# Patient Record
Sex: Male | Born: 1980 | Race: Black or African American | Hispanic: No | Marital: Married | Smoking: Never smoker
Health system: Southern US, Community
[De-identification: ages and names within clinical notes are randomized; demographics above are authoritative.]

## PROBLEM LIST (undated history)

## (undated) DIAGNOSIS — R569 Unspecified convulsions: Secondary | ICD-10-CM

## (undated) DIAGNOSIS — I10 Essential (primary) hypertension: Secondary | ICD-10-CM

## (undated) HISTORY — PX: ANTERIOR CRUCIATE LIGAMENT REPAIR: SHX115

## (undated) HISTORY — PX: SHOULDER SURGERY: SHX246

## (undated) HISTORY — PX: APPENDECTOMY: SHX54

## (undated) HISTORY — PX: FRACTURE SURGERY: SHX138

---

## 2019-10-08 ENCOUNTER — Emergency Department (HOSPITAL_BASED_OUTPATIENT_CLINIC_OR_DEPARTMENT_OTHER)
Admission: EM | Admit: 2019-10-08 | Discharge: 2019-10-08 | Disposition: A | Discharge status: Home / Self Care | Payer: BLUE CROSS/BLUE SHIELD | Attending: Emergency Medicine | Admitting: Emergency Medicine

## 2019-10-08 ENCOUNTER — Other Ambulatory Visit: Payer: Self-pay

## 2019-10-08 ENCOUNTER — Emergency Department (HOSPITAL_BASED_OUTPATIENT_CLINIC_OR_DEPARTMENT_OTHER): Payer: BLUE CROSS/BLUE SHIELD

## 2019-10-08 ENCOUNTER — Encounter (HOSPITAL_BASED_OUTPATIENT_CLINIC_OR_DEPARTMENT_OTHER): Payer: Self-pay | Admitting: Emergency Medicine

## 2019-10-08 DIAGNOSIS — M25571 Pain in right ankle and joints of right foot: Secondary | ICD-10-CM | POA: Diagnosis present

## 2019-10-08 DIAGNOSIS — R2241 Localized swelling, mass and lump, right lower limb: Secondary | ICD-10-CM | POA: Diagnosis not present

## 2019-10-08 DIAGNOSIS — S86001A Unspecified injury of right Achilles tendon, initial encounter: Secondary | ICD-10-CM

## 2019-10-08 MED ORDER — HYDROCODONE-ACETAMINOPHEN 5-325 MG PO TABS: 1.0000 | ORAL_TABLET | ORAL | 0 refills | Status: DC | PRN

## 2019-10-08 MED ORDER — OXYCODONE-ACETAMINOPHEN 5-325 MG PO TABS
1.0000 | ORAL_TABLET | Freq: Once | ORAL | Status: DC
  Filled 2019-10-08: qty 1

## 2019-10-08 NOTE — ED Notes (Signed)
Pt declined ordered pain medication. RN offered non-narcotic alternative such as Motrin. Pt stated he has that at home and will take it once he is home.

## 2019-10-08 NOTE — ED Notes (Signed)
Pt discharged to home. Discharge instructions have been discussed with patient and/or family members. Pt verbally acknowledges understanding d/c instructions, and endorses comprehension to checkout at registration before leaving.  °

## 2019-10-08 NOTE — ED Triage Notes (Signed)
Pt reports playing basketball causing injury to right ankle with a pop sensation. Pt ambulatory with limp, feels he may have torn his achillis tendon.

## 2019-10-08 NOTE — Discharge Instructions (Signed)
You can take Tylenol or Ibuprofen as directed for pain. You can alternate Tylenol and Ibuprofen every 4 hours. If you take Tylenol at 1pm, then you can take Ibuprofen at 5pm. Then you can take Tylenol again at 9pm.   Take pain medications as directed for break through pain. Do not drive or operate machinery while taking this medication.   As we discussed, you will wear the cam walker boot. He can do weightbearing as tolerated. If becomes too painful, you can use the crutches.  As discussed, you will follow-up with Dr. Magnus Ivan. Please call his office arrange for follow-up appointment.  Return emergency department for any worsening pain, swelling, numbness/weakness or any other worsening concerning symptoms.

## 2019-10-08 NOTE — ED Provider Notes (Signed)
[facility] EMERGENCY DEPARTMENT Provider Note   CSN: [id number] Arrival date & time: 10/08/19  1758     History Chief Complaint  Patient presents with  . Ankle Injury    Alex Fields is a 39 y.o. male who presents for evaluation of right ankle pain and swelling.  He reports that about 2:30 PM this afternoon, he was playing basketball.  He reports that he went to step off of his right foot and all of a sudden felt a pop sensation in the posterior aspect of his leg.  Since then he has had swelling and pain.  He reports that he can limp and put some weight on it but he has difficulty moving the foot.  He has not taken anything for pain.  He denies any numbness/weakness.  The history is provided by the patient.       History reviewed. No pertinent past medical history.  There are no problems to display for this patient.   Past Surgical History:  Procedure Laterality Date  . ANTERIOR CRUCIATE LIGAMENT REPAIR    . APPENDECTOMY    . SHOULDER SURGERY         No family history on file.  Social History   Tobacco Use  . Smoking status: Never Smoker  . Smokeless tobacco: Never Used  Substance Use Topics  . Alcohol use: Yes  . Drug use: Never    Home Medications Prior to Admission medications   Medication Sig Start Date End Date Taking? Authorizing Provider  HYDROcodone-acetaminophen (NORCO/VICODIN) 5-325 MG tablet Take 1-2 tablets by mouth every 4 (four) hours as needed. 10/08/19   Volanda Napoleon, PA-C    Allergies    Patient has no known allergies.  Review of Systems   Review of Systems  Musculoskeletal:       Right ankle pain  Neurological: Negative for weakness and numbness.  All other systems reviewed and are negative.   Physical Exam Updated Vital Signs BP (!) 145/109 (BP Location: Right Arm)   Pulse 87   Temp 98.4 F (36.9 C) (Oral)   Resp 20   Ht 6' (1.829 m)   Wt 110.2 kg   SpO2 98%   BMI 32.96 kg/m   Physical Exam Vitals and  nursing note reviewed.  Constitutional:      Appearance: He is well-developed.  HENT:     Head: Normocephalic and atraumatic.  Eyes:     General: No scleral icterus.       Right eye: No discharge.        Left eye: No discharge.     Conjunctiva/sclera: Conjunctivae normal.  Cardiovascular:     Pulses:          Dorsalis pedis pulses are 2+ on the right side and 2+ on the left side.  Pulmonary:     Effort: Pulmonary effort is normal.  Musculoskeletal:     Comments: Tenderness palpation noted to the dorsal aspect of the distal right leg and heel with soft tissue swelling noted.  There is deficits noted with palpation of the Achilles tendon. Positive Thompson test. He can plantarflex but has difficulty dorsiflexing.  He can wiggle all 5 toes.  No bony tenderness noted bilateral malleolus, dorsal aspect of foot.  No distal or proximal tib-fib tenderness palpation.  No deformity or crepitus.  No tenderness palpation left lower extremity.  Skin:    General: Skin is warm and dry.     Capillary Refill: Capillary refill takes less than 2  seconds.     Comments: Good distal cap refill.  RLE is not dusky in appearance or cool to touch.   Neurological:     Mental Status: He is alert.  Psychiatric:        Speech: Speech normal.        Behavior: Behavior normal.     ED Results / Procedures / Treatments   Labs (all labs ordered are listed, but only abnormal results are displayed) Labs Reviewed - No data to display  EKG None  Radiology DG Ankle Complete Right  Result Date: 10/08/2019 CLINICAL DATA:  Popping sensation while playing basketball, unable to bear weight EXAM: RIGHT ANKLE - COMPLETE 3+ VIEW COMPARISON:  None FINDINGS: Corticated fragment seen adjacent the posterior talar process may be remote posttraumatic or accessory ossicle. No acute bony abnormality. Specifically, no fracture, subluxation, or dislocation. Suspect a trace ankle effusion. There is some questionable thickening and  edematous swelling towards the myotendinous junction of the Achilles with slight being stranding within Kager's fat pad. Distal tendon appears normal aside from a small mineralized enthesophyte and a posterior calcaneal spur. Included portions of the mid and hindfoot are unremarkable though are incompletely assessed on nonweightbearing, non dedicated radiographs. IMPRESSION: 1. No definite acute osseous abnormality. 2. Corticated fragment adjacent the posterior talar process may be remote posttraumatic or accessory ossicle. 3. Soft tissue thickening and edematous change towards the myotendinous junction of the Achilles with slight stranding within Kager's fat pad. Correlate with point tenderness and assessment of the patient's biomechanics. Electronically Signed   By: Kreg Shropshire M.D.   On: 10/08/2019 18:57    Procedures Procedures (including critical care time)  Medications Ordered in ED Medications  oxyCODONE-acetaminophen (PERCOCET/ROXICET) 5-325 MG per tablet 1 tablet (1 tablet Oral Not Given 10/08/19 1955)    ED Course  I have reviewed the triage vital signs and the nursing notes.  Pertinent labs & imaging results that were available during my care of the patient were reviewed by me and considered in my medical decision making (see chart for details).    MDM Rules/Calculators/A&P                      39 year old male presents for evaluation of right ankle pain and swelling that began after playing basketball earlier this evening.  He reports he was getting ready step off and states that he felt a popping sensation to the posterior aspect of his leg.  Since then has had pain, swelling.  He denies any numbness/weakness.  Initially arrival, he is afebrile, nontoxic-appearing.  Vital signs are stable.  On exam, he has tenderness palpation as well as swelling in the posterior aspect of the leg.  He has difficulty with plantarflexion of the foot. Positive Thompson test noted.  Concern for Achilles  tendon injury versus fracture versus sprain.  X-rays ordered at triage.  X-rays reviewed.  There is a corticated fragment adjacent to the posterior talar process that may be remote posttraumatic or accessory ossicle.  There is also soft tissue thickening and edematous change for the myotendinous junction of the Achilles with slight stranding within the Cager's fat pad.  This does correlate to where he is tender and he does have concerning signs for possible Achilles tendon injury.  Discussed with Dr. Magnus Ivan (Ortho).  He commence putting patient in a cam walker boot.  Keep patient can be weightbearing as tolerated.  He will plan to see the patient in outpatient follow-up.  Discussed plan  with patient.  He is agreeable. At this time, patient exhibits no emergent life-threatening condition that require further evaluation in ED or admission. Patient had ample opportunity for questions and discussion. All patient's questions were answered with full understanding. Strict return precautions discussed. Patient expresses understanding and agreement to plan.   Portions of this note were generated with Scientist, clinical (histocompatibility and immunogenetics). Dictation errors may occur despite best attempts at proofreading.   Final Clinical Impression(s) / ED Diagnoses Final diagnoses:  Injury of right Achilles tendon, initial encounter    Rx / DC Orders ED Discharge Orders         Ordered    HYDROcodone-acetaminophen (NORCO/VICODIN) 5-325 MG tablet  Every 4 hours PRN     10/08/19 2054           Maxwell Caul, PA-C 10/08/19 2352    Milagros Loll, MD 10/09/19 [id number]

## 2019-10-08 NOTE — ED Notes (Signed)
Ice pack given

## 2019-10-10 ENCOUNTER — Ambulatory Visit: Payer: BLUE CROSS/BLUE SHIELD | Admitting: Orthopedic Surgery

## 2019-10-11 ENCOUNTER — Other Ambulatory Visit: Payer: Self-pay

## 2019-10-11 ENCOUNTER — Ambulatory Visit: Payer: BLUE CROSS/BLUE SHIELD | Admitting: Physician Assistant

## 2019-10-11 ENCOUNTER — Encounter: Payer: Self-pay | Admitting: Physician Assistant

## 2019-10-11 DIAGNOSIS — S86011D Strain of right Achilles tendon, subsequent encounter: Secondary | ICD-10-CM | POA: Diagnosis not present

## 2019-10-11 NOTE — Progress Notes (Signed)
   Office Visit Note     Patient: Alex Fields           Date of Birth: 1981-05-09           MRN: [id number] Visit Date: 10/11/2019              Requested by: Lenox Ponds, MD [address] [place],  [place] [address] PCP: Randel Pigg, Dorma Russell, MD   Assessment & Plan: Visit Diagnoses:  1. Rupture of right Achilles tendon, subsequent encounter     Plan: Discussed with him conservative versus surgical treatment of his right Achilles rupture. Discussed with him risk benefits of surgery. He elects to proceed with the right Achilles with primary repair. Risks of surgery discussed with the patient at length and these include but are not limited to prolonged healing, wound healing problems, infection, and DVT/PE.  Follow-Up Instructions: Return 2 weeks post op.   Orders:  No orders of the defined types were placed in this encounter.  No orders of the defined types were placed in this encounter.     Procedures: No procedures performed   Clinical Data: No additional findings.   Subjective: Chief Complaint  Patient presents with  . Right Ankle - Injury    HPI Alex Fields is a pleasant 39 year old male comes in today with right leg injury this past Sunday. Reports he was playing basketball and it felt pain in the back of his right leg. He was seen at [facility] ER ER and was diagnosed with a right Achilles rupture. Placed in a cam walker boot. He was told to follow-up with orthopedic surgery. Denies any other injury.  He is on Keppra for seizure disorder otherwise healthy. Patient has continued to work in the cam walker boot since he was seen in the ER. 3 views right ankle dated 10/08/2019 are reviewed. These show no acute fracture. Talus well located within the ankle mortise no diastases. There is edema about the Achilles and stranding with in the area of the Kager's fat pad.  Review of systems: Negative for fevers chills see HPI.  Objective: Vital Signs:  There were no vitals taken for this visit.  Physical Exam Constitutional:      Appearance: He is not ill-appearing or diaphoretic.  Pulmonary:     Effort: Pulmonary effort is normal.  Neurological:     Mental Status: He is alert and oriented to person, place, and time.  Psychiatric:        Behavior: Behavior normal.     Ortho Exam Right calf supple nontender. Swelling right Achilles region with palpable gap mid to lower Achilles. Positive Thompson test on the right negative on the left.  Specialty Comments:  No specialty comments available.  Imaging: No results found.   PMFS History: There are no problems to display for this patient.  History reviewed. No pertinent past medical history.  History reviewed. No pertinent family history.  Past Surgical History:  Procedure Laterality Date  . ANTERIOR CRUCIATE LIGAMENT REPAIR    . APPENDECTOMY    . SHOULDER SURGERY     Social History   Occupational History  . Not on file  Tobacco Use  . Smoking status: Never Smoker  . Smokeless tobacco: Never Used  Substance and Sexual Activity  . Alcohol use: Yes  . Drug use: Never  . Sexual activity: Not on file

## 2019-10-13 ENCOUNTER — Other Ambulatory Visit: Payer: Self-pay

## 2019-10-14 ENCOUNTER — Other Ambulatory Visit (HOSPITAL_COMMUNITY)
Admission: RE | Admit: 2019-10-14 | Discharge: 2019-10-14 | Disposition: A | Discharge status: Home / Self Care | Payer: BLUE CROSS/BLUE SHIELD | Source: Ambulatory Visit | Attending: Orthopaedic Surgery | Admitting: Orthopaedic Surgery

## 2019-10-14 DIAGNOSIS — Z20822 Contact with and (suspected) exposure to covid-19: Secondary | ICD-10-CM | POA: Insufficient documentation

## 2019-10-14 DIAGNOSIS — Z01812 Encounter for preprocedural laboratory examination: Secondary | ICD-10-CM | POA: Diagnosis present

## 2019-10-14 LAB — SARS CORONAVIRUS 2 (TAT 6-24 HRS): SARS Coronavirus 2: NEGATIVE

## 2019-10-16 ENCOUNTER — Other Ambulatory Visit: Payer: Self-pay

## 2019-10-16 ENCOUNTER — Other Ambulatory Visit: Payer: Self-pay | Admitting: Physician Assistant

## 2019-10-16 ENCOUNTER — Encounter (HOSPITAL_COMMUNITY): Payer: Self-pay | Admitting: Orthopaedic Surgery

## 2019-10-16 NOTE — Progress Notes (Signed)
Spoke with pt for pre-op call. Pt denies cardiac history, HTN or Diabetes. He does have a hx of seizures but has not had any for "years" per pt.   Covid test done 10/14/19 and it's negative. Pt states he's been in quarantine since the test was done and understands he needs to continue to be in quarantine until he comes to the hospital tomorrow.

## 2019-10-17 ENCOUNTER — Encounter (HOSPITAL_COMMUNITY): Payer: Self-pay | Admitting: Orthopaedic Surgery

## 2019-10-17 ENCOUNTER — Ambulatory Visit (HOSPITAL_COMMUNITY): Payer: BLUE CROSS/BLUE SHIELD | Admitting: Certified Registered Nurse Anesthetist

## 2019-10-17 ENCOUNTER — Encounter (HOSPITAL_COMMUNITY)
Admission: RE | Disposition: A | Discharge status: Home / Self Care | Payer: Self-pay | Source: Ambulatory Visit | Attending: Orthopaedic Surgery

## 2019-10-17 ENCOUNTER — Ambulatory Visit (HOSPITAL_COMMUNITY)
Admission: RE | Admit: 2019-10-17 | Discharge: 2019-10-17 | Disposition: A | Discharge status: Home / Self Care | Payer: BLUE CROSS/BLUE SHIELD | Source: Ambulatory Visit | Attending: Orthopaedic Surgery | Admitting: Orthopaedic Surgery

## 2019-10-17 DIAGNOSIS — S86011A Strain of right Achilles tendon, initial encounter: Secondary | ICD-10-CM

## 2019-10-17 DIAGNOSIS — S86011D Strain of right Achilles tendon, subsequent encounter: Secondary | ICD-10-CM

## 2019-10-17 DIAGNOSIS — Y9367 Activity, basketball: Secondary | ICD-10-CM | POA: Diagnosis not present

## 2019-10-17 DIAGNOSIS — X58XXXA Exposure to other specified factors, initial encounter: Secondary | ICD-10-CM | POA: Diagnosis not present

## 2019-10-17 HISTORY — PX: ACHILLES TENDON SURGERY: SHX542

## 2019-10-17 HISTORY — DX: Unspecified convulsions: R56.9

## 2019-10-17 LAB — BASIC METABOLIC PANEL
Anion gap: 9 (ref 5–15)
BUN: 13 mg/dL (ref 6–20)
CO2: 23 mmol/L (ref 22–32)
Calcium: 9.4 mg/dL (ref 8.9–10.3)
Chloride: 103 mmol/L (ref 98–111)
Creatinine, Ser: 1.05 mg/dL (ref 0.61–1.24)
GFR calc Af Amer: >60 mL/min (ref >60)
GFR calc non Af Amer: >60 mL/min (ref >60)
Glucose, Bld: 101 mg/dL — ABNORMAL HIGH (ref 70–99)
Potassium: 4.4 mmol/L (ref 3.5–5.1)
Sodium: 135 mmol/L (ref 135–145)

## 2019-10-17 LAB — HEMOGLOBIN: Hemoglobin: 16.1 g/dL (ref 13.0–17.0)

## 2019-10-17 SURGERY — REPAIR, TENDON, ACHILLES
Anesthesia: General | Laterality: Right

## 2019-10-17 MED ORDER — PHENYLEPHRINE 40 MCG/ML (10ML) SYRINGE FOR IV PUSH (FOR BLOOD PRESSURE SUPPORT)
PREFILLED_SYRINGE | INTRAVENOUS | Status: DC | PRN
  Administered 2019-10-17: 80 ug via INTRAVENOUS

## 2019-10-17 MED ORDER — ONDANSETRON HCL 4 MG/2ML IJ SOLN
INTRAMUSCULAR | Status: DC | PRN
  Administered 2019-10-17: 4 mg via INTRAVENOUS

## 2019-10-17 MED ORDER — CEFAZOLIN SODIUM-DEXTROSE 2-4 GM/100ML-% IV SOLN
2.0000 g | INTRAVENOUS | Status: AC
  Administered 2019-10-17: 2 g via INTRAVENOUS
  Filled 2019-10-17: qty 100

## 2019-10-17 MED ORDER — PHENYLEPHRINE HCL-NACL 10-0.9 MG/250ML-% IV SOLN
INTRAVENOUS | Status: DC | PRN
  Administered 2019-10-17: 25 ug/min via INTRAVENOUS

## 2019-10-17 MED ORDER — LIDOCAINE 2% (20 MG/ML) 5 ML SYRINGE
INTRAMUSCULAR | Status: DC | PRN
  Administered 2019-10-17: 60 mg via INTRAVENOUS

## 2019-10-17 MED ORDER — PROPOFOL 10 MG/ML IV BOLUS
INTRAVENOUS | Status: DC | PRN
  Administered 2019-10-17: 200 mg via INTRAVENOUS

## 2019-10-17 MED ORDER — ROPIVACAINE HCL 5 MG/ML IJ SOLN
INTRAMUSCULAR | Status: DC | PRN
  Administered 2019-10-17: 10 mL via PERINEURAL
  Administered 2019-10-17: 30 mL via PERINEURAL

## 2019-10-17 MED ORDER — LABETALOL HCL 5 MG/ML IV SOLN: 10.0000 mg | Freq: Once | INTRAVENOUS | Status: AC

## 2019-10-17 MED ORDER — SODIUM CHLORIDE 0.9 % IR SOLN
Status: DC | PRN
  Administered 2019-10-17: 1000 mL

## 2019-10-17 MED ORDER — FENTANYL CITRATE (PF) 100 MCG/2ML IJ SOLN
INTRAMUSCULAR | Status: AC
  Administered 2019-10-17: 100 ug via INTRAVENOUS
  Filled 2019-10-17: qty 2

## 2019-10-17 MED ORDER — DEXAMETHASONE SODIUM PHOSPHATE 10 MG/ML IJ SOLN
INTRAMUSCULAR | Status: DC | PRN
  Administered 2019-10-17: 10 mg via INTRAVENOUS

## 2019-10-17 MED ORDER — LABETALOL HCL 5 MG/ML IV SOLN
INTRAVENOUS | Status: AC
  Administered 2019-10-17: 10 mg via INTRAVENOUS
  Filled 2019-10-17: qty 4

## 2019-10-17 MED ORDER — CHLORHEXIDINE GLUCONATE 0.12 % MT SOLN
15.0000 mL | Freq: Once | OROMUCOSAL | Status: AC
  Administered 2019-10-17: 15 mL via OROMUCOSAL
  Filled 2019-10-17: qty 15

## 2019-10-17 MED ORDER — ORAL CARE MOUTH RINSE: 15.0000 mL | Freq: Once | OROMUCOSAL | Status: AC

## 2019-10-17 MED ORDER — HYDROCODONE-ACETAMINOPHEN 5-325 MG PO TABS: 1.0000 | ORAL_TABLET | Freq: Four times a day (QID) | ORAL | 0 refills | Status: DC | PRN

## 2019-10-17 MED ORDER — ROCURONIUM BROMIDE 10 MG/ML (PF) SYRINGE
PREFILLED_SYRINGE | INTRAVENOUS | Status: DC | PRN
  Administered 2019-10-17: 70 mg via INTRAVENOUS

## 2019-10-17 MED ORDER — PROPOFOL 10 MG/ML IV BOLUS
INTRAVENOUS | Status: AC
  Filled 2019-10-17: qty 20

## 2019-10-17 MED ORDER — SUGAMMADEX SODIUM 200 MG/2ML IV SOLN
INTRAVENOUS | Status: DC | PRN
  Administered 2019-10-17: 250 mg via INTRAVENOUS

## 2019-10-17 MED ORDER — FENTANYL CITRATE (PF) 250 MCG/5ML IJ SOLN
INTRAMUSCULAR | Status: DC | PRN
  Administered 2019-10-17 (×2): 50 ug via INTRAVENOUS

## 2019-10-17 MED ORDER — FENTANYL CITRATE (PF) 100 MCG/2ML IJ SOLN: 100.0000 ug | Freq: Once | INTRAMUSCULAR | Status: AC

## 2019-10-17 MED ORDER — FENTANYL CITRATE (PF) 250 MCG/5ML IJ SOLN
INTRAMUSCULAR | Status: AC
  Filled 2019-10-17: qty 5

## 2019-10-17 MED ORDER — MIDAZOLAM HCL 2 MG/2ML IJ SOLN
INTRAMUSCULAR | Status: AC
  Filled 2019-10-17: qty 2

## 2019-10-17 MED ORDER — MIDAZOLAM HCL 2 MG/2ML IJ SOLN: 2.0000 mg | Freq: Once | INTRAMUSCULAR | Status: AC

## 2019-10-17 MED ORDER — MIDAZOLAM HCL 2 MG/2ML IJ SOLN
INTRAMUSCULAR | Status: AC
  Administered 2019-10-17: 2 mg via INTRAVENOUS
  Filled 2019-10-17: qty 2

## 2019-10-17 MED ORDER — LACTATED RINGERS IV SOLN: INTRAVENOUS | Status: DC

## 2019-10-17 SURGICAL SUPPLY — 37 items
BNDG ELASTIC 4X5.8 VLCR STR LF (GAUZE/BANDAGES/DRESSINGS) ×1 IMPLANT
BNDG ESMARK 4X9 LF (GAUZE/BANDAGES/DRESSINGS) ×1 IMPLANT
CUFF TOURN SGL QUICK 34 (TOURNIQUET CUFF) ×1
CUFF TRNQT CYL 34X4.125X (TOURNIQUET CUFF) IMPLANT
DRAPE U-SHAPE 47X51 STRL (DRAPES) ×2 IMPLANT
DRSG XEROFORM 1X8 (GAUZE/BANDAGES/DRESSINGS) ×1 IMPLANT
DURAPREP 26ML APPLICATOR (WOUND CARE) ×2 IMPLANT
ELECT REM PT RETURN 9FT ADLT (ELECTROSURGICAL) ×2
ELECTRODE REM PT RTRN 9FT ADLT (ELECTROSURGICAL) ×1 IMPLANT
GAUZE SPONGE 4X4 12PLY STRL LF (GAUZE/BANDAGES/DRESSINGS) ×1 IMPLANT
GLOVE BIO SURGEON STRL SZ8 (GLOVE) ×2 IMPLANT
GLOVE BIOGEL PI IND STRL 8 (GLOVE) ×1 IMPLANT
GLOVE BIOGEL PI INDICATOR 8 (GLOVE) ×1
GLOVE ORTHO TXT STRL SZ7.5 (GLOVE) ×2 IMPLANT
GOWN STRL REUS W/ TWL LRG LVL3 (GOWN DISPOSABLE) ×2 IMPLANT
GOWN STRL REUS W/ TWL XL LVL3 (GOWN DISPOSABLE) ×4 IMPLANT
GOWN STRL REUS W/TWL LRG LVL3 (GOWN DISPOSABLE) ×2
GOWN STRL REUS W/TWL XL LVL3 (GOWN DISPOSABLE) ×4
KIT TURNOVER KIT B (KITS) ×2 IMPLANT
NS IRRIG 1000ML POUR BTL (IV SOLUTION) ×2 IMPLANT
PACK ORTHO EXTREMITY (CUSTOM PROCEDURE TRAY) ×2 IMPLANT
PAD ARMBOARD 7.5X6 YLW CONV (MISCELLANEOUS) ×4 IMPLANT
PAD CAST 4YDX4 CTTN HI CHSV (CAST SUPPLIES) IMPLANT
PADDING CAST COTTON 4X4 STRL (CAST SUPPLIES) ×1
SUT ETHIBOND NAB CT1 #1 30IN (SUTURE) ×1 IMPLANT
SUT ETHILON 2 0 FS 18 (SUTURE) ×2 IMPLANT
SUT FIBERWIRE #2 38 T-5 BLUE (SUTURE) ×4
SUT VIC AB 0 CT1 27 (SUTURE) ×1
SUT VIC AB 0 CT1 27XBRD ANBCTR (SUTURE) IMPLANT
SUT VIC AB 1 CT1 36 (SUTURE) ×1 IMPLANT
SUT VIC AB 2-0 CT1 27 (SUTURE) ×1
SUT VIC AB 2-0 CT1 TAPERPNT 27 (SUTURE) IMPLANT
SUTURE FIBERWR #2 38 T-5 BLUE (SUTURE) ×4 IMPLANT
TOWEL GREEN STERILE (TOWEL DISPOSABLE) ×2 IMPLANT
TOWEL GREEN STERILE FF (TOWEL DISPOSABLE) ×2 IMPLANT
TUBE CONNECTING 12X1/4 (SUCTIONS) ×2 IMPLANT
YANKAUER SUCT BULB TIP NO VENT (SUCTIONS) ×2 IMPLANT

## 2019-10-17 NOTE — Anesthesia Preprocedure Evaluation (Addendum)
Anesthesia Evaluation  Patient identified by MRN, date of birth, ID band Patient awake    Reviewed: Allergy & Precautions, NPO status , Patient's Chart, lab work & pertinent test results  History of Anesthesia Complications Negative for: history of anesthetic complications  Airway Mallampati: II  TM Distance: >3 FB Neck ROM: Full    Dental  (+) Teeth Intact   Pulmonary neg pulmonary ROS,    Pulmonary exam normal        Cardiovascular hypertension (not formally diagnosed, no meds), Normal cardiovascular exam     Neuro/Psych Seizures -,  negative psych ROS   GI/Hepatic negative GI ROS, Neg liver ROS,   Endo/Other  negative endocrine ROS  Renal/GU negative Renal ROS  negative genitourinary   Musculoskeletal negative musculoskeletal ROS (+)   Abdominal   Peds  Hematology negative hematology ROS (+)   Anesthesia Other Findings   Reproductive/Obstetrics                            Anesthesia Physical Anesthesia Plan  ASA: III  Anesthesia Plan: General   Post-op Pain Management: GA combined w/ Regional for post-op pain   Induction: Intravenous  PONV Risk Score and Plan: 2 and Ondansetron, Dexamethasone, Midazolam and Treatment may vary due to age or medical condition  Airway Management Planned: Oral ETT  Additional Equipment: None  Intra-op Plan:   Post-operative Plan: Extubation in OR  Informed Consent: I have reviewed the patients History and Physical, chart, labs and discussed the procedure including the risks, benefits and alternatives for the proposed anesthesia with the patient or authorized representative who has indicated his/her understanding and acceptance.     Dental advisory given  Plan Discussed with:   Anesthesia Plan Comments:        Anesthesia Quick Evaluation

## 2019-10-17 NOTE — Brief Op Note (Signed)
10/17/2019  3:26 PM  PATIENT:  Alex Fields  39 y.o. male  PRE-OPERATIVE DIAGNOSIS:  right achilles rupture  POST-OPERATIVE DIAGNOSIS:  right achilles rupture  PROCEDURE:  Procedure(s): PRIMARY RIGHT ACHILLES TENDON REPAIR (Right)  SURGEON:  Surgeon(s) and Role:    Kathryne Hitch, MD - Primary  PHYSICIAN ASSISTANT: Rexene Edison, PA-C  ANESTHESIA:   regional and general  EBL:  10 mL   COUNTS:  YES  TOURNIQUET:  * Missing tourniquet times found for documented tourniquets in log: [id number] *  DICTATION: .Other Dictation: Dictation Number [id number]  PLAN OF CARE: Discharge to home after PACU  PATIENT DISPOSITION:  PACU - hemodynamically stable.   Delay start of Pharmacological VTE agent (>24hrs) due to surgical blood loss or risk of bleeding: no

## 2019-10-17 NOTE — Progress Notes (Signed)
Orthopedic Tech Progress Note Patient Details:  Alex Fields [birth year] [id number]  Ortho Devices Type of Ortho Device: Crutches Ortho Device/Splint Interventions: Ordered   Post Interventions Instructions Provided: Adjustment of device, Care of device, Poper ambulation with device   PACU called and requested crutches for the patient.   Gerald Stabs 10/17/2019, 5:05 PM

## 2019-10-17 NOTE — Anesthesia Procedure Notes (Signed)
Procedure Name: Intubation Date/Time: 10/17/2019 2:35 PM Performed by: Bryson Corona, CRNA Pre-anesthesia Checklist: Patient identified, Emergency Drugs available, Suction available and Patient being monitored Patient Re-evaluated:Patient Re-evaluated prior to induction Oxygen Delivery Method: Circle System Utilized Preoxygenation: Pre-oxygenation with 100% oxygen Induction Type: IV induction Ventilation: Oral airway inserted - appropriate to patient size and Two handed mask ventilation required Laryngoscope Size: Mac and 4 Grade View: Grade I Tube type: Oral Tube size: 7.5 mm Number of attempts: 1 Airway Equipment and Method: Stylet and Oral airway Placement Confirmation: ETT inserted through vocal cords under direct vision,  positive ETCO2 and breath sounds checked- equal and bilateral Secured at: 23 cm Tube secured with: Tape Dental Injury: Teeth and Oropharynx as per pre-operative assessment

## 2019-10-17 NOTE — H&P (Signed)
Alex Fields is an 39 y.o. male.   Chief Complaint: Right ankle pain and weakness with known Achilles tendon rupture HPI: The patient is a 39 year old gentleman who over a week ago was playing basketball and had an injury sustained to his right ankle at the Achilles area.  He was seen in the emergency room and placed appropriate in a cam walking boot.  We saw him in the office and he had a positive Thompson test with an obvious deficit in the Achilles to the right side.  We recommended a direct primary repair of an acute complete Achilles tendon rupture on the right side.  Past Medical History:  Diagnosis Date  . Seizures (HCC)     Past Surgical History:  Procedure Laterality Date  . ANTERIOR CRUCIATE LIGAMENT REPAIR    . APPENDECTOMY    . FRACTURE SURGERY Right    wrist   . SHOULDER SURGERY      History reviewed. No pertinent family history. Social History:  reports that he has never smoked. He has never used smokeless tobacco. He reports current alcohol use. He reports that he does not use drugs.  Allergies: No Known Allergies  No medications prior to admission.    No results found for this or any previous visit (from the past 48 hour(s)). No results found.  Review of Systems  All other systems reviewed and are negative.   There were no vitals taken for this visit. Physical Exam  Constitutional: He is oriented to person, place, and time. He appears well-developed and well-nourished.  HENT:  Head: Normocephalic and atraumatic.  Eyes: Pupils are equal, round, and reactive to light. EOM are normal.  Respiratory: Effort normal.  GI: Soft.  Musculoskeletal:     Cervical back: Normal range of motion and neck supple.     Right ankle: Swelling and ecchymosis present. Decreased range of motion.     Right Achilles Tendon: Thompson's test positive.  Neurological: He is alert and oriented to person, place, and time.  Skin: Skin is warm and dry.     Assessment/Plan Complete  right Achilles tendon rupture  Our plan is to proceed to surgery today as an outpatient for direct primary repair of the ruptured right Achilles tendon.  A thorough discussion of the risk and benefits of surgery was had and informed consent is obtained.    Kathryne Hitch, MD 10/17/2019, 11:25 AM

## 2019-10-17 NOTE — Anesthesia Procedure Notes (Signed)
Anesthesia Regional Block: Popliteal block   Pre-Anesthetic Checklist: ,, timeout performed, Correct Patient, Correct Site, Correct Laterality, Correct Procedure, Correct Position, site marked, Risks and benefits discussed,  Surgical consent,  Pre-op evaluation,  At surgeon's request and post-op pain management  Laterality: Right  Prep: chloraprep       Needles:  Injection technique: Single-shot  Needle Type: Echogenic Stimulator Needle     Needle Length: 10cm  Needle Gauge: 20     Additional Needles:   Procedures:,,,, ultrasound used (permanent image in chart),,,,  Narrative:  Start time: 10/17/2019 1:56 PM End time: 10/17/2019 1:59 PM Injection made incrementally with aspirations every 5 mL.  Performed by: Personally  Anesthesiologist: Lucretia Kern, MD  Additional Notes: Monitors applied. Injection made in 5cc increments. No resistance to injection. Good needle visualization. Patient tolerated procedure well.

## 2019-10-17 NOTE — Transfer of Care (Signed)
Immediate Anesthesia Transfer of Care Note  Patient: Alex Fields  Procedure(s) Performed: PRIMARY RIGHT ACHILLES TENDON REPAIR (Right )  Patient Location: PACU  Anesthesia Type:General  Level of Consciousness: drowsy  Airway & Oxygen Therapy: Patient Spontanous Breathing and Patient connected to face mask oxygen  Post-op Assessment: Report given to RN and Post -op Vital signs reviewed and stable  Post vital signs: Reviewed and stable  Last Vitals:  Vitals Value Taken Time  BP 147/93 10/17/19 1551  Temp    Pulse 82 10/17/19 1552  Resp 19 10/17/19 1552  SpO2 100 % 10/17/19 1552  Vitals shown include unvalidated device data.  Last Pain:  Vitals:   10/17/19 1247  TempSrc: Temporal  PainSc:       Patients Stated Pain Goal: 6 (10/17/19 1238)  Complications: No apparent anesthesia complications

## 2019-10-17 NOTE — Anesthesia Postprocedure Evaluation (Signed)
Anesthesia Post Note  Patient: Alex Fields  Procedure(s) Performed: PRIMARY RIGHT ACHILLES TENDON REPAIR (Right )     Patient location during evaluation: PACU Anesthesia Type: General Level of consciousness: awake and alert Pain management: pain level controlled Vital Signs Assessment: post-procedure vital signs reviewed and stable Respiratory status: spontaneous breathing, nonlabored ventilation, respiratory function stable and patient connected to nasal cannula oxygen Cardiovascular status: blood pressure returned to baseline and stable Postop Assessment: no apparent nausea or vomiting Anesthetic complications: no    Last Vitals:  Vitals:   10/17/19 1405 10/17/19 1551  BP: (!) 167/117 (!) 147/93  Pulse: 76 92  Resp: 14 18  Temp:  36.4 C  SpO2: 100% 100%    Last Pain:  Vitals:   10/17/19 1551  TempSrc:   PainSc: 0-No pain                 Lucretia Kern

## 2019-10-17 NOTE — Anesthesia Procedure Notes (Signed)
Anesthesia Regional Block: Adductor canal block   Pre-Anesthetic Checklist: ,, timeout performed, Correct Patient, Correct Site, Correct Laterality, Correct Procedure, Correct Position, site marked, Risks and benefits discussed,  Surgical consent,  Pre-op evaluation,  At surgeon's request and post-op pain management  Laterality: Right  Prep: chloraprep       Needles:  Injection technique: Single-shot  Needle Type: Echogenic Stimulator Needle     Needle Length: 10cm  Needle Gauge: 20     Additional Needles:   Procedures:,,,, ultrasound used (permanent image in chart),,,,  Narrative:  Start time: 10/17/2019 1:53 PM End time: 10/17/2019 1:56 PM Injection made incrementally with aspirations every 5 mL.  Performed by: Personally  Anesthesiologist: Lucretia Kern, MD  Additional Notes: Monitors applied. Injection made in 5cc increments. No resistance to injection. Good needle visualization. Patient tolerated procedure well.

## 2019-10-17 NOTE — Discharge Instructions (Signed)
Keep your right lower extremity splint clean and dry. You will need to be nonweightbearing on the right ankle for the next 4 weeks. Elevate your right leg and ankle as needed for swelling. Bring your walking boot to your first postoperative follow-up appointment with orthopedics. Please take a full-strength 325 mg aspirin daily.

## 2019-10-18 NOTE — Op Note (Signed)
NAMEDEONTRAY, Alex Fields MEDICAL RECORD [id number] ACCOUNT [id number] DATE OF BIRTH:1980-12-08 FACILITY: [facility] LOCATION: [facility] PHYSICIAN:Karnell Vanderloop Aretha Parrot, MD  OPERATIVE REPORT  DATE OF PROCEDURE:  10/17/2019  PREOPERATIVE DIAGNOSIS:  Right complete Achilles tendon rupture.  POSTOPERATIVE DIAGNOSIS:  Right complete Achilles tendon rupture.  PROCEDURE:  Direct primary repair of ruptured right Achilles tendon.  SURGEON:  Vanita Panda. Magnus Ivan, MD  ASSISTANT:  Richardean Canal, PA-C  ANESTHESIA: 1.  Right lower extremity popliteal block. 2.  General.  TOURNIQUET TIME:  Less than 1 hour.  ESTIMATED BLOOD LOSS:  Less than 25 mL.  ANTIBIOTICS:  Two grams IV Ancef.  COMPLICATIONS:  None.  INDICATIONS:  The patient is a 39 year old gentleman who was playing basketball just over a week ago when he sustained an injury to his right Achilles tendon sustaining a complete rupture.  We saw him in the office and he had a positive Thompson test  with a deficit in the Achilles tendon.  We did recommend a direct primary repair.  We explained to him the options of nonoperative and operative treatment options and what the risks and benefits involved of both of these options.  He did elect to proceed  with surgery.  He has been in a Cam walking boot with limited weightbearing.  DESCRIPTION OF PROCEDURE:  After informed consent was obtained and appropriate right ankle was marked anesthesia obtained a popliteal block in the holding room.  He was then brought to the operating room.  General anesthesia was obtained while he was on  a stretcher in supine position.  He was then rolled in a prone position with appropriate padding with chest rolls and padding of bony prominences.  His genitals were assessed and protected and his arms were placed in appropriate position.  A nonsterile  tourniquet was placed around his upper right thigh before he was rolled over.  His calf, ankle and foot were prepped  and draped with DuraPrep and sterile drapes.  A timeout was called and he was identified as correct patient, correct right ankle.  We  confirmed the rupture with a positive Thompson test.  We used the Esmarch to wrap out the ankle tourniquet was inflated to 300 mm of pressure.  I then made a direct midline incision over the Achilles and carried this proximally and distally.  My incision  was just off the midline.  We dissected down to the Achilles and found a complete rupture of the Achilles tendon.  We freshened of the ruptured ends and then ran a Krakow suture through the proximal stump and 1 through the distal stump.  We were able to  tie these down to bring the Achilles rupture together.  There was a separate piece of the Achilles that we then undersewed and oversewed to the Achilles stump distally using #2 Ethibond suture.  We then performed a Janee Morn test again, and his foot did  plantarflex.  We then closed the deep tissue over the Achilles with 0 Vicryl followed by 2-0 Vicryl to close the subcutaneous tissue and interrupted 2-0 nylon to reapproximate the skin.  Xeroform and well-padded sterile dressing was applied and his foot  was placed in a plantar flexed position in a splint.  He was rolled back into supine position, awakened, extubated, and taken to recovery room in stable condition.  All final counts were correct.  There were no complications noted.  Postoperatively, he  will be discharged home from the PACU with nonweightbearing instructions include splint care instructions.  He will  follow up in the office in 2 weeks.  Of note, Benita Stabile, PA-C, assisted in the entire case.  His assistance was crucial for facilitating  all aspects of this case.  CN/NUANCE  D:10/17/2019 T:10/18/2019 JOB:[id number]

## 2019-10-31 ENCOUNTER — Telehealth: Payer: Self-pay | Admitting: Orthopaedic Surgery

## 2019-10-31 ENCOUNTER — Other Ambulatory Visit: Payer: Self-pay

## 2019-10-31 ENCOUNTER — Encounter: Payer: Self-pay | Admitting: Orthopaedic Surgery

## 2019-10-31 ENCOUNTER — Ambulatory Visit (INDEPENDENT_AMBULATORY_CARE_PROVIDER_SITE_OTHER): Payer: BLUE CROSS/BLUE SHIELD | Admitting: Orthopaedic Surgery

## 2019-10-31 DIAGNOSIS — S86011D Strain of right Achilles tendon, subsequent encounter: Secondary | ICD-10-CM

## 2019-10-31 NOTE — Telephone Encounter (Signed)
LMOM for patient letting him know that we can fax his notes but most likely they will still want the forms filled out

## 2019-10-31 NOTE — Telephone Encounter (Signed)
Patient called asked if the attending provider statement that he left 10/25/2019 was faxed back to his insurance company. The number to contact patient is [phone]

## 2019-10-31 NOTE — Progress Notes (Addendum)
HPI: Alex Fields returns now 2 weeks status post right Achilles tendon primary repair.  He has been nonweightbearing.  He did have a fall came down on the leg on his right leg.  Otherwise he is doing well.  He has been nonweightbearing using a knee scooter.  He is taking baby aspirin daily.  He is no longer taking any pain medications.  Physical exam: Right Achilles surgical incision is healing well well approximated wound interrupted nylon sutures no signs of infection.  Thompson's test is negative.  Right calf supple nontender.  Impression: Status post right Achilles tendon repair 10/17/2019  Plan: He will remain nonweightbearing with cam walker boot until we see him back at next office visit.  Sutures were removed Steri-Strips applied.  We will see him back in 1 month sooner if there is any questions concerns.  He is to wear the boot at all times except when showering or at rest.  Questions were encouraged and answered.  He will remain on his baby aspirin daily while nonweightbearing.

## 2019-10-31 NOTE — Telephone Encounter (Signed)
Pt called wanting to confirm the status of his [employer] paperwork; pt also stated that every time he comes in and needs something filled out he's paid $25 dollars and was wondering if there was a way we could just fax his appt notes to his job after the appt? Pt would like a call back.   [phone]

## 2019-11-01 ENCOUNTER — Telehealth: Payer: Self-pay | Admitting: Orthopaedic Surgery

## 2019-11-01 NOTE — Telephone Encounter (Signed)
Patient called.   He is following up on a attending physicians statement that was given to [place] on 10/25/2019  Call back: [phone]

## 2019-11-01 NOTE — Telephone Encounter (Signed)
Patient states he talked to [place] and they got everything faxed

## 2019-11-08 ENCOUNTER — Ambulatory Visit: Payer: Self-pay

## 2019-11-08 ENCOUNTER — Telehealth: Payer: Self-pay | Admitting: Orthopaedic Surgery

## 2019-11-08 ENCOUNTER — Telehealth: Payer: Self-pay

## 2019-11-08 DIAGNOSIS — M25561 Pain in right knee: Secondary | ICD-10-CM

## 2019-11-08 NOTE — Telephone Encounter (Signed)
Not sure if I can really do anything with just that information.  He's welcome to make an appt with Bronson Curb or Magnus Ivan for next available.  Thanks.

## 2019-11-08 NOTE — Telephone Encounter (Signed)
Patient called advised he is having joint pain in his hands,neck and body. Patient asked if this is related to the surgery? Patient had surgery 10/17/2019. The number to contact patient is [phone]

## 2019-11-08 NOTE — Telephone Encounter (Signed)
Patient called stating that for the last 3 days he has been having pain in his hands, neck, and back in the mornings, but as the day goes on, it gets better.  Patient had right achilles tendon surgery on 10/17/2019.  CB# [phone].  Please advise.  Thank you.

## 2019-11-08 NOTE — Telephone Encounter (Signed)
Can you advise? 

## 2019-11-08 NOTE — Telephone Encounter (Signed)
Patient aware that these are not common side effects

## 2019-11-10 ENCOUNTER — Other Ambulatory Visit: Payer: Self-pay

## 2019-11-10 ENCOUNTER — Emergency Department (HOSPITAL_COMMUNITY)
Admission: EM | Admit: 2019-11-10 | Discharge: 2019-11-10 | Disposition: A | Discharge status: Home / Self Care | Payer: BLUE CROSS/BLUE SHIELD | Attending: Emergency Medicine | Admitting: Emergency Medicine

## 2019-11-10 ENCOUNTER — Encounter (HOSPITAL_COMMUNITY): Payer: Self-pay

## 2019-11-10 ENCOUNTER — Ambulatory Visit (INDEPENDENT_AMBULATORY_CARE_PROVIDER_SITE_OTHER)
Admission: EM | Admit: 2019-11-10 | Discharge: 2019-11-10 | Disposition: A | Discharge status: Other Acute Inpatient Hospital | Payer: BLUE CROSS/BLUE SHIELD | Source: Home / Self Care

## 2019-11-10 ENCOUNTER — Emergency Department (HOSPITAL_COMMUNITY): Payer: BLUE CROSS/BLUE SHIELD

## 2019-11-10 DIAGNOSIS — Z79899 Other long term (current) drug therapy: Secondary | ICD-10-CM | POA: Insufficient documentation

## 2019-11-10 DIAGNOSIS — Z20822 Contact with and (suspected) exposure to covid-19: Secondary | ICD-10-CM | POA: Diagnosis not present

## 2019-11-10 DIAGNOSIS — R079 Chest pain, unspecified: Secondary | ICD-10-CM

## 2019-11-10 DIAGNOSIS — R0789 Other chest pain: Secondary | ICD-10-CM | POA: Insufficient documentation

## 2019-11-10 DIAGNOSIS — Z7982 Long term (current) use of aspirin: Secondary | ICD-10-CM | POA: Diagnosis not present

## 2019-11-10 LAB — SARS CORONAVIRUS 2 BY RT PCR (HOSPITAL ORDER, PERFORMED IN ~~LOC~~ HOSPITAL LAB): SARS Coronavirus 2: NEGATIVE

## 2019-11-10 LAB — CBC
HCT: 49.1 % (ref 39.0–52.0)
Hemoglobin: 16.2 g/dL (ref 13.0–17.0)
MCH: 28.3 pg (ref 26.0–34.0)
MCHC: 33 g/dL (ref 30.0–36.0)
MCV: 85.7 fL (ref 80.0–100.0)
Platelets: 244 10*3/uL (ref 150–400)
RBC: 5.73 MIL/uL (ref 4.22–5.81)
RDW: 13.2 % (ref 11.5–15.5)
WBC: 7 10*3/uL (ref 4.0–10.5)
nRBC: 0 % (ref 0.0–0.2)

## 2019-11-10 LAB — BASIC METABOLIC PANEL
Anion gap: 12 (ref 5–15)
BUN: 15 mg/dL (ref 6–20)
CO2: 22 mmol/L (ref 22–32)
Calcium: 9.7 mg/dL (ref 8.9–10.3)
Chloride: 100 mmol/L (ref 98–111)
Creatinine, Ser: 1.02 mg/dL (ref 0.61–1.24)
GFR calc Af Amer: >60 mL/min (ref >60)
GFR calc non Af Amer: >60 mL/min (ref >60)
Glucose, Bld: 96 mg/dL (ref 70–99)
Potassium: 4.7 mmol/L (ref 3.5–5.1)
Sodium: 134 mmol/L — ABNORMAL LOW (ref 135–145)

## 2019-11-10 LAB — SEDIMENTATION RATE: Sed Rate: 5 mm/hr (ref 0–16)

## 2019-11-10 LAB — D-DIMER, QUANTITATIVE: D-Dimer, Quant: 0.45 ug/mL-FEU (ref 0.00–0.50)

## 2019-11-10 LAB — TROPONIN I (HIGH SENSITIVITY)
Troponin I (High Sensitivity): 6 ng/L (ref <18)
Troponin I (High Sensitivity): 7 ng/L (ref <18)

## 2019-11-10 LAB — C-REACTIVE PROTEIN: CRP: 1 mg/dL — ABNORMAL HIGH (ref <1.0)

## 2019-11-10 MED ORDER — NAPROXEN 500 MG PO TABS: 500.0000 mg | ORAL_TABLET | Freq: Two times a day (BID) | ORAL | 0 refills | Status: AC

## 2019-11-10 NOTE — ED Provider Notes (Signed)
[facility] EMERGENCY DEPARTMENT Provider Note   CSN: [id number] Arrival date & time: 11/10/19  1052     History Chief Complaint  Patient presents with  . Chest Pain    Alex Fields is a 39 y.o. male.  HPI   Pt is a 39 y/o male with a h/o seizures who presents to the ED today for eval of chest pain that started 5 days ago and has gradually worsened since onset. States 5 days ago he also experienced diffuse myalgias to his entire upper body. This has improved but the chest pain has persisted. Pain located to the middle of the chest and feels like a pressure. Rates pain 6/10. Pain is constant. Pain is worse when laying flat and somewhat worse with deep inspiration. Has some mild SOB.   States he had either myocarditis or pericarditis several years ago and sxs feel similar. Denies fever, URI sxs, NVD, urinary sxs. Denies BLE swelling. Has some swelling in the RLE 2/2 surgery but otherwise denies any new/worsening swelling.   He also c/o that he has had decreased grip strength to the BUE that is worse in the mornings and improves throughout the day. Denies any numbness or weakness throughout the remainder of his extremities.   Per chart review, pt has h/o myopleuropericarditis in 2018.  Past Medical History:  Diagnosis Date  . Seizures [facility])     Patient Active Problem List   Diagnosis Date Noted  . Rupture of right Achilles tendon 10/17/2019    Past Surgical History:  Procedure Laterality Date  . ACHILLES TENDON SURGERY Right 10/17/2019   Procedure: PRIMARY RIGHT ACHILLES TENDON REPAIR;  Surgeon: Kathryne Hitch, MD;  Location: [facility] [place] OR;  Service: Orthopedics;  Laterality: Right;  . ANTERIOR CRUCIATE LIGAMENT REPAIR    . APPENDECTOMY    . FRACTURE SURGERY Right    wrist   . SHOULDER SURGERY         History reviewed. No pertinent family history.  Social History   Tobacco Use  . Smoking status: Never Smoker  . Smokeless tobacco: Never Used    Vaping Use  . Vaping Use: Never used  Substance Use Topics  . Alcohol use: Yes    Comment: social  . Drug use: Never    Home Medications Prior to Admission medications   Medication Sig Start Date End Date Taking? Authorizing Provider  acetaminophen (TYLENOL) 325 MG tablet Take 650 mg by mouth every 4 (four) hours as needed for mild pain.    Yes [provider]  aspirin (BAYER LOW DOSE) 81 MG EC tablet Take 81 mg by mouth daily. Swallow whole.   Yes [provider]  Ibuprofen (IBU-200 PO) Take 400 mg by mouth daily as needed (for pain).    Yes [provider]  levETIRAcetam (KEPPRA) 500 MG tablet Take 1,500 mg by mouth in the morning and at bedtime.    Yes [provider]  naproxen (NAPROSYN) 500 MG tablet Take 1 tablet (500 mg total) by mouth 2 (two) times daily for 7 days. 11/10/19 11/17/19  Maleaha Hughett S, PA-C    Allergies    Patient has no known allergies.  Review of Systems   Review of Systems  Constitutional: Negative for chills and fever.  HENT: Negative for ear pain and sore throat.   Eyes: Negative for visual disturbance.  Respiratory: Negative for shortness of breath.   Cardiovascular: Positive for chest pain and leg swelling (right leg (s/p surgery)).  Gastrointestinal: Negative  for abdominal pain, constipation, diarrhea, nausea and vomiting.  Genitourinary: Negative for dysuria and hematuria.  Musculoskeletal: Positive for myalgias and neck pain.  Skin: Negative for rash.  Neurological: Positive for weakness. Negative for numbness.  All other systems reviewed and are negative.   Physical Exam Updated Vital Signs BP (!) 151/104   Pulse 81   Temp 98.2 F (36.8 C) (Oral)   Resp 16   Ht 6' (1.829 m)   Wt 111.1 kg   SpO2 100%   BMI 33.23 kg/m   Physical Exam Vitals and nursing note reviewed.  Constitutional:      Appearance: He is well-developed.  HENT:     Head: Normocephalic and atraumatic.  Eyes:      Conjunctiva/sclera: Conjunctivae normal.  Cardiovascular:     Rate and Rhythm: Normal rate and regular rhythm.     Heart sounds: No murmur heard.   Pulmonary:     Effort: Pulmonary effort is normal. No respiratory distress.     Breath sounds: Normal breath sounds. No decreased breath sounds, wheezing, rhonchi or rales.  Chest:     Chest wall: Tenderness (midsternal TTP) present.  Abdominal:     Palpations: Abdomen is soft.     Tenderness: There is no abdominal tenderness.  Musculoskeletal:     Cervical back: Neck supple.     Comments: RLE with walking boot in place. No TTP to the cervical spine and bilat cervical paraspinous muscles  Skin:    General: Skin is warm and dry.  Neurological:     Mental Status: He is alert.     Comments: 5/5 strength to the bilateral upper extremities with normal sensation throughout      ED Results / Procedures / Treatments   Labs (all labs ordered are listed, but only abnormal results are displayed) Labs Reviewed  BASIC METABOLIC PANEL - Abnormal; Notable for the following components:      Result Value   Sodium 134 (*)    All other components within normal limits  C-REACTIVE PROTEIN - Abnormal; Notable for the following components:   CRP 1.0 (*)    All other components within normal limits  SARS CORONAVIRUS 2 BY RT PCR (HOSPITAL ORDER, PERFORMED IN [place] [facility])  CBC  SEDIMENTATION RATE  D-DIMER, QUANTITATIVE (NOT AT [facility])  TROPONIN I (HIGH SENSITIVITY)  TROPONIN I (HIGH SENSITIVITY)    EKG EKG Interpretation  Date/Time:  Friday November 10 2019 10:58:57 EDT Ventricular Rate:  83 PR Interval:  172 QRS Duration: 80 QT Interval:  340 QTC Calculation: 399 R Axis:   58 Text Interpretation: Normal sinus rhythm Cannot rule out Anterior infarct , age undetermined Abnormal ECG Confirmed by Marianna Fuss ([id number]) on 11/10/2019 5:53:40 PM   Radiology DG Chest 2 View  Result Date: 11/10/2019 CLINICAL DATA:  Chest pain. EXAM:  CHEST - 2 VIEW COMPARISON:  None. FINDINGS: The heart size and mediastinal contours are within normal limits. Both lungs are clear. No pneumothorax or pleural effusion is noted. The visualized skeletal structures are unremarkable. IMPRESSION: No active cardiopulmonary disease. Electronically Signed   By: Lupita Raider M.D.   On: 11/10/2019 11:51    Procedures Procedures (including critical care time)  Medications Ordered in ED Medications - No data to display  ED Course  I have reviewed the triage vital signs and the nursing notes.  Pertinent labs & imaging results that were available during my care of the patient were reviewed by me and considered in my medical  decision making (see chart for details).    MDM Rules/Calculators/A&P                          39 y/o M presenting for eval of CP ongoing for 5 days.  History of myocardial pericarditis.  Also with bilateral hand weakness that is intermittent in the mornings but improves throughout the day.  Neuro exam is normal on my exam.  Chest producible on exam.  Cardiac exam with regular rate and pulmonary exam is benign.  Vital signs reassuring  Reviewed/interpreted  CBC nonacute BMP nonacute Trop neg, repeat trop neg Sed rate neg CRP marginally elevated Ddimer negative COVID pending upon discharge  EKG Normal sinus rhythm Cannot rule out Anterior infarct , age undetermined Abnormal ECG  CXR  No active cardiopulmonary disease  Patient's work-up is very reassuring.  He does not have any elevation in his inflammatory markers or troponin to suggest myocarditis or pericarditis.  His EKG is reassuring.  Chest x-ray is within normal limits.  D-dimer is negative therefore low suspicion for PE.  His chest pain is also reproducible on my exam.  Feel symptoms may be related to MSK cause.  Will start on anti-inflammatories.  With regards to his complaints of weakness in the hands, I did not appreciate any significant weakness on exam and his  neurologic exam was reassuring.  Feel he can follow-up with PCP about this and return to ED for new or worsening symptoms. All questions answered, pt stable for d/c.   Case discussed with Dr. Roslynn Amble who evaluated pt and is in agreement with plan.   Final Clinical Impression(s) / ED Diagnoses Final diagnoses:  Atypical chest pain    Rx / DC Orders ED Discharge Orders         Ordered    naproxen (NAPROSYN) 500 MG tablet  2 times daily     Discontinue  Reprint     11/10/19 2014           Rodney Booze, PA-C 11/10/19 2021    Lucrezia Starch, MD 11/10/19 [phone]

## 2019-11-10 NOTE — ED Triage Notes (Signed)
Pt arrives POV for eval of CP x 5 days. Pt reports associated SOB, and blt arm weakness for same amt of time. Pt reports CP worse w/ laying down, states unable to sleep last night. Endorses hx of myocarditis 3 years ago and reports this feels similar. Hx of achilles tendon repair to RLE 3 weeks ago.

## 2019-11-10 NOTE — ED Notes (Signed)
Patient is being discharged from the Urgent Care Center and sent to the Emergency Department via wheelchair by staff. Per Dr Tracie Harrier, patient is stable but in need of higher level of care due to chest pain. Patient is aware and verbalizes understanding of plan of care.   Vitals:   11/10/19 1032  BP: (!) 151/107  Pulse: 88  Resp: 18  Temp: 98.4 F (36.9 C)  SpO2: 97%

## 2019-11-10 NOTE — ED Notes (Signed)
[phone] Alex Fields sister  Would like an update please . As soon as you can

## 2019-11-10 NOTE — ED Triage Notes (Signed)
Pt presents with central chest pain X 2 days that he describes as throbbing & achy. Pt states he recently had surgery on acheilles tendon & had concerns of possible blood clot.  Pt also has Hx of myocarditis.

## 2019-11-10 NOTE — ED Notes (Signed)
Patient verbalizes understanding of discharge instructions. Opportunity for questioning and answers were provided. Armband removed by staff, pt discharged from ED ambulatory.   

## 2019-11-10 NOTE — Discharge Instructions (Addendum)

## 2019-11-24 ENCOUNTER — Telehealth: Payer: Self-pay | Admitting: Orthopaedic Surgery

## 2019-11-24 NOTE — Telephone Encounter (Signed)
Called patient he did drop off paperwork on Tuesday to be filled out. Advised neither Dr. Magnus Ivan or Morrie Sheldon are here today to ask about the paperwork, I would have to look and give him a call back.  Patient was very frustrated stated he has had problems before with [employer] getting his paperwork filled out before work, now he dropped paperwork off at the front desk for assistant or Dr. Magnus Ivan to fill out and has not received a call. Informed patient again I would need to look for paperwork as Dr.Blackman or Morrie Sheldon are not here to ask, if paperwork is found will workon it however if Dr. Magnus Ivan has not filled his part out it will not be done until Tuesday when our office opens back up.  Patient was still frustrated advise patient that Dr. Magnus Ivan had to leave office before lunch due to being sick, was not in office Wednesday due to being sick, Thursday he was in surgery and saw patients, and today is in surgery all day. If forms are complete I will call patient back for pick up, if not it will be Tuesday to bring forms to attention.

## 2019-11-24 NOTE — Telephone Encounter (Signed)
I found forms in question in inbox.   Is patient released to full duty or modified duty and for how long? What are work restrictions? Any driving restrictions? Ground work only, no climbing? Any bending, pushing, twisting? Prolonged standing or sitting? Use of arms or legs? Use of stairs?   Please advise. Patient aware that you will not be back in office until Tuesday, if you want to hold until then.

## 2019-11-24 NOTE — Telephone Encounter (Signed)
Patient called about the status of return to work note for employer. Please call patient regarding this matter. Patient phone number is [phone]

## 2019-11-28 ENCOUNTER — Telehealth: Payer: Self-pay | Admitting: Orthopaedic Surgery

## 2019-11-28 NOTE — Telephone Encounter (Signed)
Patient is frustrated but as I told him, we haven't seen him, so we don't know necessarily how to feel them out until we evaluate him He has an appt tomorrow and we will evaluate him at that time

## 2019-11-28 NOTE — Telephone Encounter (Signed)
Patient called. He would like to know if the form was ready to be picked up for his job. His call back number is [phone]

## 2019-11-29 ENCOUNTER — Other Ambulatory Visit: Payer: Self-pay

## 2019-11-29 ENCOUNTER — Encounter: Payer: Self-pay | Admitting: Orthopaedic Surgery

## 2019-11-29 ENCOUNTER — Ambulatory Visit (INDEPENDENT_AMBULATORY_CARE_PROVIDER_SITE_OTHER): Payer: BLUE CROSS/BLUE SHIELD | Admitting: Orthopaedic Surgery

## 2019-11-29 DIAGNOSIS — S86011D Strain of right Achilles tendon, subsequent encounter: Secondary | ICD-10-CM

## 2019-11-29 NOTE — Progress Notes (Signed)
HPI: Alex Fields returns today 6 weeks status post right Achilles repair 10/17/2019.  He is overall doing well in regards to the Achilles.  He is still ambulating in the cam walker boot with 2 heel lifts and using knee scooter.  He does state he does bear some full weight on it.  He has been to the emergency room due to chest pain shortness of breath shoulder pain and hand pain.  He had labs drawn in the ER but were normal.  He is seeing his primary care physician today due to the soreness in his hands shoulder the chest pain.  Physical exam: Right Achilles Thompson test negative surgical incisions well-healed.  Calf supple nontender.  No abnormal edema of the right lower leg some slight as expected edema involving the right foot.  Bilateral hands slight edema no rashes skin changes abnormal warmth erythema.  No triggering of the fingers.  States he is unable to fully extend his his fingers I can passively bring his fingers to full extension bilateral hands.  This causes some discomfort in the hands.  Impression: Status post right Achilles tendon repair 10/17/2019  Plan: His request he will return to work tomorrow sedentary work only.  He will continue the cam walker boot with one heel lift for the next 2 weeks then transition to a regular shoe with a heel lift for another 2 weeks.  We will see him back in 4 weeks to reevaluate his work status at that time.  Questions encouraged and answered.  Paperwork was filled out for his return to work today.

## 2019-12-01 ENCOUNTER — Telehealth: Payer: Self-pay | Admitting: Orthopaedic Surgery

## 2019-12-01 NOTE — Telephone Encounter (Signed)
Patient called requesting a revised doctor's note. Patient's employer would like a date stating when patient can return to full duty/ no restrictions. Patient phone is [phone].

## 2019-12-01 NOTE — Telephone Encounter (Signed)
Full work duties without restrictions would be 01/22/20.

## 2019-12-01 NOTE — Telephone Encounter (Signed)
Left a message on patients VM that we have letter ready for him

## 2019-12-27 ENCOUNTER — Ambulatory Visit: Payer: BLUE CROSS/BLUE SHIELD | Admitting: Orthopaedic Surgery

## 2019-12-28 ENCOUNTER — Ambulatory Visit (INDEPENDENT_AMBULATORY_CARE_PROVIDER_SITE_OTHER): Payer: BLUE CROSS/BLUE SHIELD | Admitting: Orthopaedic Surgery

## 2019-12-28 ENCOUNTER — Encounter: Payer: Self-pay | Admitting: Orthopaedic Surgery

## 2019-12-28 DIAGNOSIS — S86011D Strain of right Achilles tendon, subsequent encounter: Secondary | ICD-10-CM

## 2019-12-28 NOTE — Progress Notes (Signed)
HPI: Alex Fields returns today follow-up status post right Achilles primary repair rupture 10/17/2019.  He is overall doing well.  He does have some stiffness and burning feelings at times.  He is wanting to return to work on 01/01/2020 modified duties and then return to full duties without restrictions on 01/22/2020.  He is taking no pain medication.  Physical exam: Right calf supple.  Thompson's test is negative.  Has formed a bit of a keloid over the surgical incision but no signs of dehiscence or wound infection.  He has slightly limited dorsiflexion of the right ankle compared to left full plantarflexion.  Dorsal pedal pulse right foot is intact.  Impression: Status post primary repair right Achilles 10/17/2019  Plan: Discussed with him shoe wear he should avoid open back shoes.  Also showed him some gastrocsoleus stretching exercises recommend he do these before in the sporting activities or running activities.  Paperwork was filled out for him to return to work light duties on 01/01/2020 and full duties on 01/22/2020.  Recommend scar tissue mobilization and also Mederma to help with the keloid formation.  Follow-up with [place] on as-needed basis.  Questions encouraged

## 2019-12-29 ENCOUNTER — Telehealth: Payer: Self-pay | Admitting: Orthopedic Surgery

## 2019-12-29 NOTE — Telephone Encounter (Signed)
Pt called stating he had an appt yesterday and needed a little more info for his job and would like to bring in a return to work sheet today and see if he can get Chales Abrahams or Denny Peon to fill out.  [phone]

## 2019-12-29 NOTE — Telephone Encounter (Signed)
Can you take a look at this and help fill it out? I laid it on your desk

## 2021-07-30 IMAGING — DX DG ANKLE COMPLETE 3+V*R*
3 series · 3 of 3 positions shown · non-contrast
Comparison: None

CLINICAL DATA: Popping sensation while playing basketball, unable
to bear weight

EXAM:
RIGHT ANKLE - COMPLETE 3+ VIEW

[ankle ap]
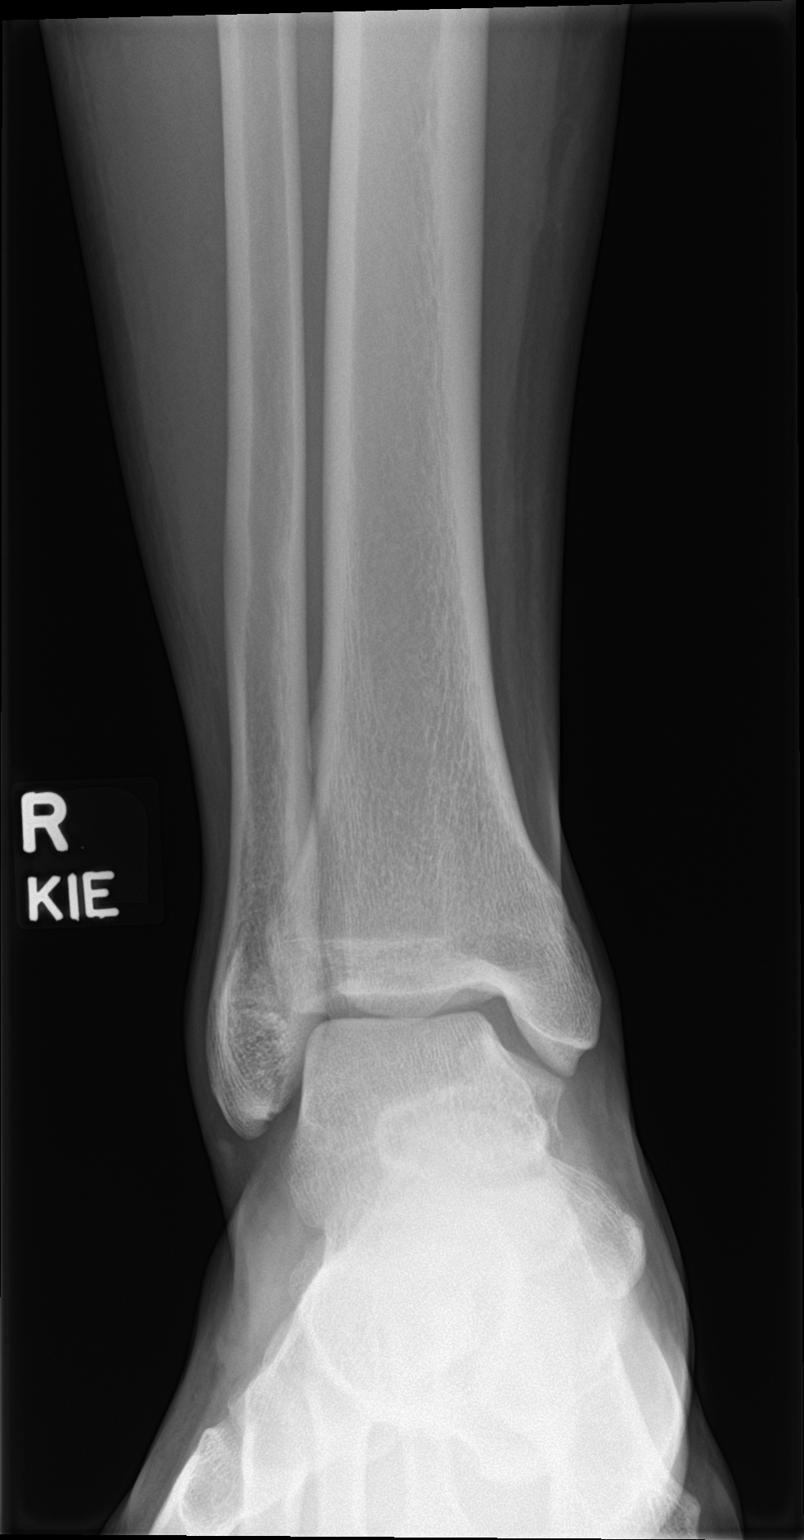

[ankle obl]
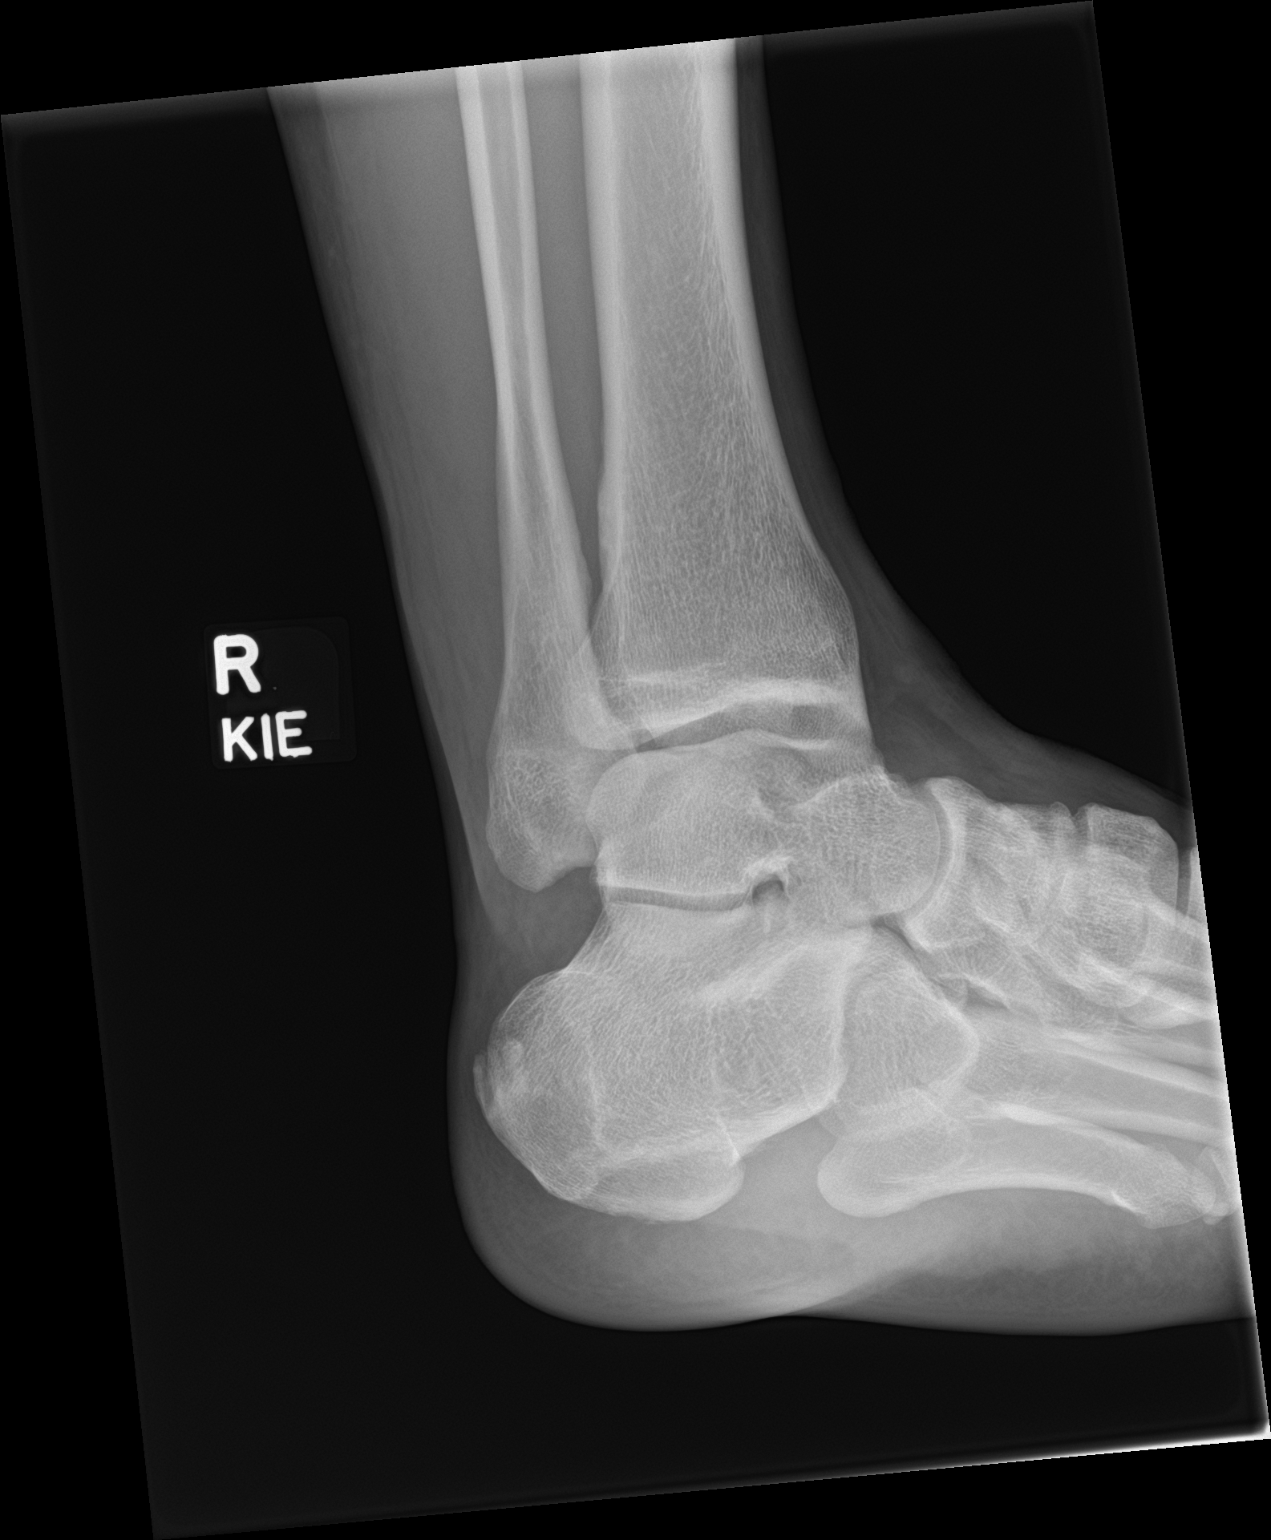

[ankle lat]
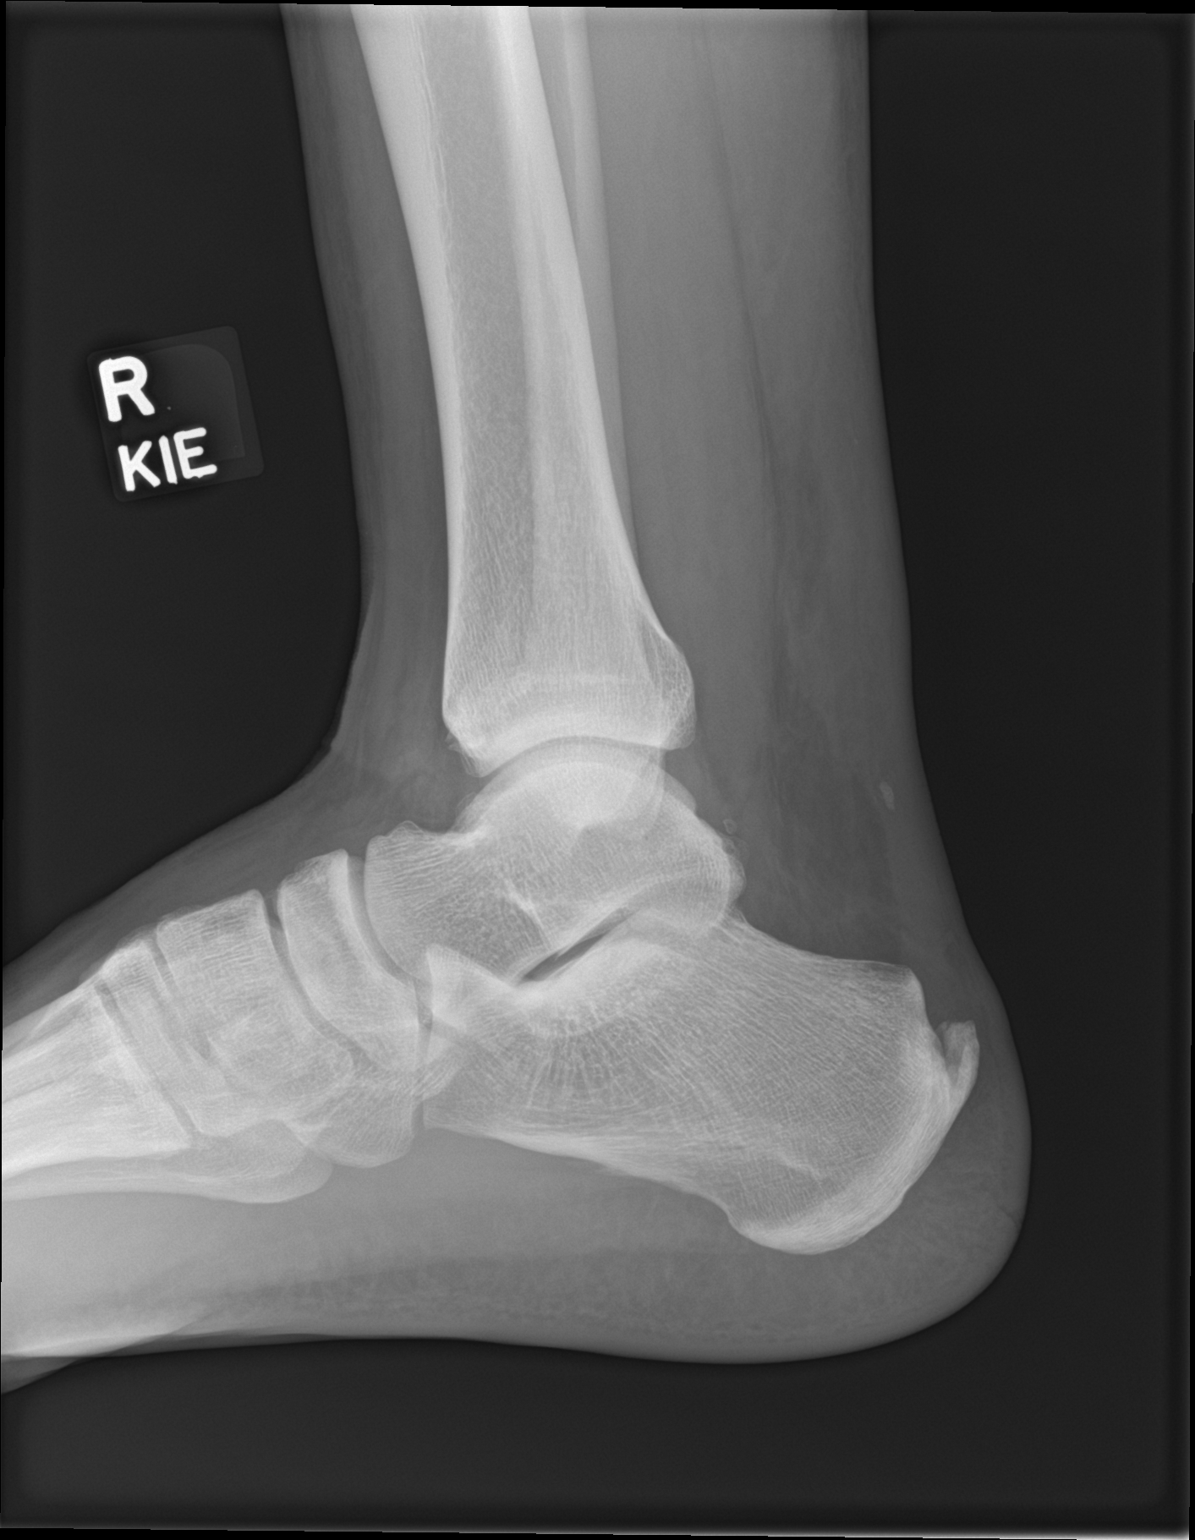

[3 of 3 positions shown; findings below may reference images not displayed]

FINDINGS: Corticated fragment seen adjacent the posterior talar process may be
remote posttraumatic or accessory ossicle. No acute bony
abnormality. Specifically, no fracture, subluxation, or dislocation.
Suspect a trace ankle effusion. There is some questionable
thickening and edematous swelling towards the myotendinous junction
of the Achilles with slight being stranding within Kager's fat pad.
Distal tendon appears normal aside from a small mineralized
enthesophyte and a posterior calcaneal spur. Included portions of
the mid and hindfoot are unremarkable though are incompletely
assessed on nonweightbearing, non dedicated radiographs.
IMPRESSION: 1. No definite acute osseous abnormality.
2. Corticated fragment adjacent the posterior talar process may be
remote posttraumatic or accessory ossicle.
3. Soft tissue thickening and edematous change towards the
myotendinous junction of the Achilles with slight stranding within
Kager's fat pad. Correlate with point tenderness and assessment of
the patient's biomechanics.

## 2021-09-01 IMAGING — DX DG CHEST 2V
2 series · 2 of 2 positions shown · non-contrast
Comparison: None.

CLINICAL DATA: Chest pain.

EXAM:
CHEST - 2 VIEW

[chest pa]
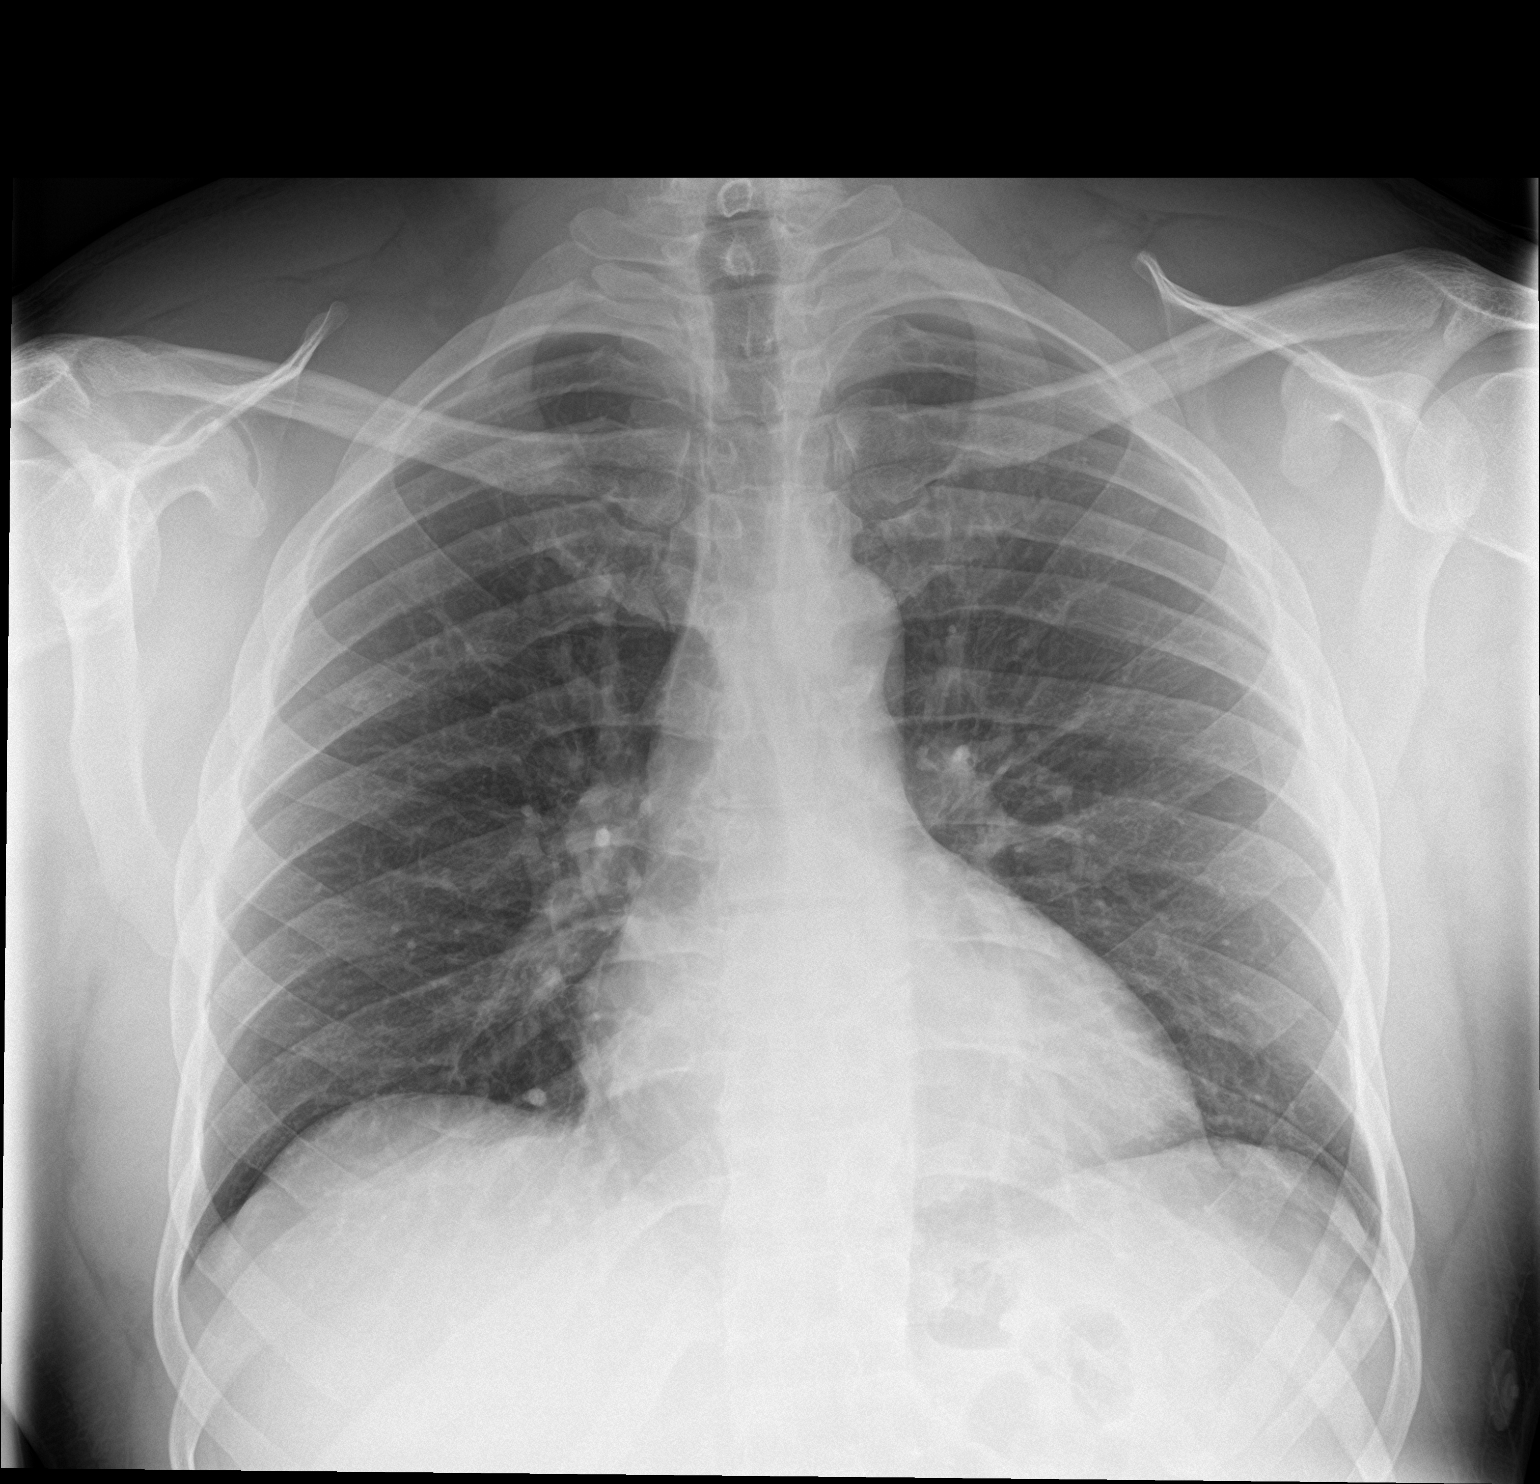

[chest lat]
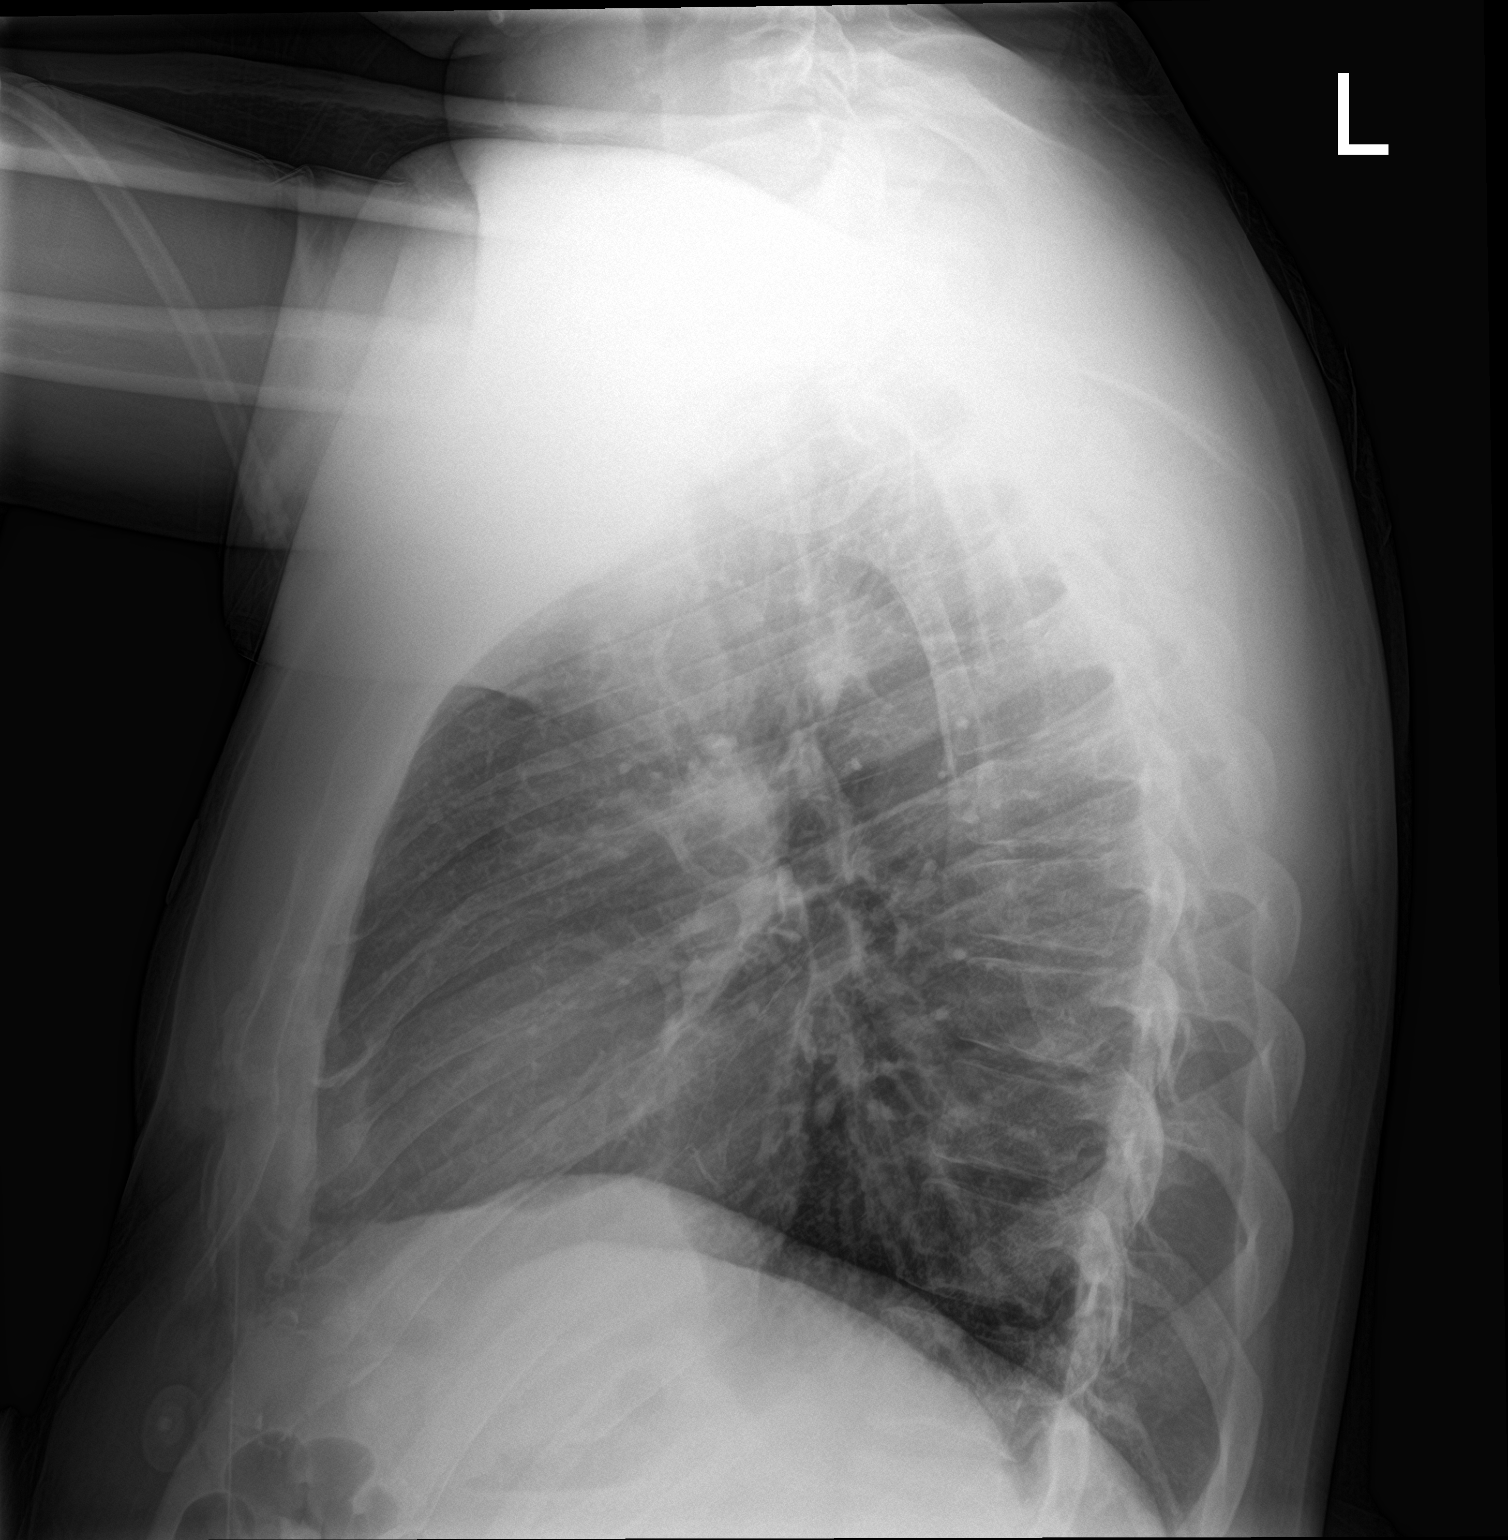

[2 of 2 positions shown; findings below may reference images not displayed]

FINDINGS: The heart size and mediastinal contours are within normal limits.
Both lungs are clear. No pneumothorax or pleural effusion is noted.
The visualized skeletal structures are unremarkable.
IMPRESSION: No active cardiopulmonary disease.

## 2022-12-11 ENCOUNTER — Other Ambulatory Visit: Payer: Self-pay

## 2022-12-11 ENCOUNTER — Encounter (HOSPITAL_BASED_OUTPATIENT_CLINIC_OR_DEPARTMENT_OTHER): Payer: Self-pay | Admitting: Emergency Medicine

## 2022-12-11 ENCOUNTER — Emergency Department (HOSPITAL_BASED_OUTPATIENT_CLINIC_OR_DEPARTMENT_OTHER)
Admission: EM | Admit: 2022-12-11 | Discharge: 2022-12-11 | Disposition: A | Discharge status: Home / Self Care | Payer: BLUE CROSS/BLUE SHIELD | Attending: Emergency Medicine | Admitting: Emergency Medicine

## 2022-12-11 DIAGNOSIS — L989 Disorder of the skin and subcutaneous tissue, unspecified: Secondary | ICD-10-CM | POA: Diagnosis not present

## 2022-12-11 DIAGNOSIS — Z7982 Long term (current) use of aspirin: Secondary | ICD-10-CM | POA: Diagnosis not present

## 2022-12-11 DIAGNOSIS — I10 Essential (primary) hypertension: Secondary | ICD-10-CM | POA: Diagnosis not present

## 2022-12-11 LAB — BASIC METABOLIC PANEL
Anion gap: 10 (ref 5–15)
BUN: 17 mg/dL (ref 6–20)
CO2: 24 mmol/L (ref 22–32)
Calcium: 9.4 mg/dL (ref 8.9–10.3)
Chloride: 102 mmol/L (ref 98–111)
Creatinine, Ser: 1.18 mg/dL (ref 0.61–1.24)
GFR, Estimated: >60 mL/min (ref >60)
Glucose, Bld: 99 mg/dL (ref 70–99)
Potassium: 4.4 mmol/L (ref 3.5–5.1)
Sodium: 136 mmol/L (ref 135–145)

## 2022-12-11 LAB — CBC
HCT: 47 % (ref 39.0–52.0)
Hemoglobin: 15.8 g/dL (ref 13.0–17.0)
MCH: 28.7 pg (ref 26.0–34.0)
MCHC: 33.6 g/dL (ref 30.0–36.0)
MCV: 85.3 fL (ref 80.0–100.0)
Platelets: 227 10*3/uL (ref 150–400)
RBC: 5.51 MIL/uL (ref 4.22–5.81)
RDW: 13.6 % (ref 11.5–15.5)
WBC: 7.5 10*3/uL (ref 4.0–10.5)
nRBC: 0 % (ref 0.0–0.2)

## 2022-12-11 NOTE — ED Provider Notes (Signed)
[place] [facility] Provider Note   CSN: [id number] Arrival date & time: 12/11/22  2005     History  Chief Complaint  Patient presents with   Hypertension    Alex Fields is a 42 y.o. male.   Hypertension   Patient is a 42 year old male with chief complaint of elevated blood pressure.  He states he was seen in urgent care earlier today for urticaria on his scalp and was told to come to the emergency room because of his blood pressure.  He states he had a mild headache earlier today.  He states it is completely resolved now and he has no symptoms.  Denies any chest pain or difficulty breathing no lightheadedness or dizziness no headache.  He states he has been prescribed blood pressure medications in the past but has not taken them.  He is uncertain of what exactly was prescribed.  He states that it was a low dose blood pressure medication.    Home Medications Prior to Admission medications   Medication Sig Start Date End Date Taking? Authorizing Provider  acetaminophen (TYLENOL) 325 MG tablet Take 650 mg by mouth every 4 (four) hours as needed for mild pain.     [provider]  aspirin (BAYER LOW DOSE) 81 MG EC tablet Take 81 mg by mouth daily. Swallow whole.    [provider]  Ibuprofen (IBU-200 PO) Take 400 mg by mouth daily as needed (for pain).     [provider]  levETIRAcetam (KEPPRA) 500 MG tablet Take 1,500 mg by mouth in the morning and at bedtime.     [provider]  naproxen (NAPROSYN) 500 MG tablet  11/10/19   [provider]      Allergies    Patient has no known allergies.    Review of Systems   Review of Systems  Physical Exam Updated Vital Signs BP (!) 164/123   Pulse 78   Temp 98.1 F (36.7 C) (Oral)   Resp 16   Ht 5\' 11"  (1.803 m)   Wt 107.5 kg   SpO2 98%   BMI 33.05 kg/m  Physical Exam Vitals and nursing note reviewed.  Constitutional:      General: He is  not in acute distress. HENT:     Head: Normocephalic and atraumatic.     Nose: Nose normal.  Eyes:     General: No scleral icterus. Cardiovascular:     Rate and Rhythm: Normal rate and regular rhythm.     Pulses: Normal pulses.     Heart sounds: Normal heart sounds.  Pulmonary:     Effort: Pulmonary effort is normal. No respiratory distress.     Breath sounds: No wheezing.  Abdominal:     Palpations: Abdomen is soft.     Tenderness: There is no abdominal tenderness.  Musculoskeletal:     Cervical back: Normal range of motion.     Right lower leg: No edema.     Left lower leg: No edema.  Skin:    General: Skin is warm and dry.     Capillary Refill: Capillary refill takes less than 2 seconds.  Neurological:     Mental Status: He is alert. Mental status is at baseline.     Comments: Alert and oriented to self, place, time and event.   Speech is fluent, clear without dysarthria or dysphasia.   Strength 5/5 in upper/lower extremities   Sensation intact in upper/lower extremities   Normal gait.  CN II grossly intact visual fields bilaterally. Did not visualize posterior eye.  CN III, IV, VI PERRLA and EOMs intact bilaterally  CN V Intact sensation to sharp and light touch to the face  CN VII facial movements symmetric  CN VIII not tested  CN IX, X no uvula deviation, symmetric rise of soft palate  CN XI 5/5 SCM and trapezius strength bilaterally  CN XII Midline tongue protrusion, symmetric L/R movements     Psychiatric:        Mood and Affect: Mood normal.        Behavior: Behavior normal.     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  Procedures Procedures    Medications Ordered in ED Medications - No data to display  ED Course/ Medical Decision Making/ A&P                             Medical Decision Making Amount and/or Complexity of Data Reviewed Labs: ordered.   Patient is a 42 year old male with chief complaint of elevated blood pressure.  He states he was seen in urgent care earlier today for urticaria on his scalp and was told to come to the emergency room because of his blood pressure.  He states he had a mild headache earlier today.  He states it is completely resolved now and he has no symptoms.  Denies any chest pain or difficulty breathing no lightheadedness or dizziness no headache.  He states he has been prescribed blood pressure medications in the past but has not taken them.  He is uncertain of what exactly was prescribed.  He states that it was a low dose blood pressure medication.  Patient with BMP and CBC unremarkable creatinine is normal EKG does not appear significant changes from his prior EKG in 2021 and he has no chest pain or difficulty breathing.  Offered patient additional workup including CT scan of head and cardiac enzyme level he states that he would prefer to be discharged home at this time he feels well has no symptoms and states that he will restart his home blood pressure medications.  I did this is a reasonable neck step.  Return precautions discussed with patient.  Will discharge home.   Final Clinical Impression(s) / ED Diagnoses Final diagnoses:  Hypertension, unspecified type  Scalp lesion    Rx / DC Orders ED Discharge Orders     None         Gailen Shelter, [place] 12/12/22 0000    Lonell Grandchild, MD 12/12/22 1139

## 2022-12-11 NOTE — Discharge Instructions (Addendum)
As we discussed we can certainly do additional workup here in the emergency room however with your reassuring lab results I do not think it is unreasonable to allow you to follow-up with your primary care doctor.  Please return the emergency room if you develop new headache, develop any chest pain difficulty breathing or any other new or concerning symptoms.  Otherwise please restart your blood pressure medication, avoid taking ibuprofen or naproxen or Aleve.  Take your prescribed occasions as prescribed  I have also given you the information for the [facility] health and wellness clinic however I suspect you would be seen more rapidly if you contact your insurance provider for a list of providers that are covered.

## 2022-12-11 NOTE — ED Triage Notes (Signed)
Pt was seen at [facility] for a different issue and was sent here d/t hypertension, has diastolic in the 120s, has had a dull headache all day, denies any cp or other sxs

## 2023-11-22 ENCOUNTER — Other Ambulatory Visit: Payer: Self-pay

## 2023-11-22 ENCOUNTER — Emergency Department (HOSPITAL_BASED_OUTPATIENT_CLINIC_OR_DEPARTMENT_OTHER)
Admission: EM | Admit: 2023-11-22 | Discharge: 2023-11-22 | Disposition: A | Discharge status: Home / Self Care | Attending: Emergency Medicine | Admitting: Emergency Medicine

## 2023-11-22 ENCOUNTER — Emergency Department (HOSPITAL_BASED_OUTPATIENT_CLINIC_OR_DEPARTMENT_OTHER)

## 2023-11-22 ENCOUNTER — Encounter (HOSPITAL_BASED_OUTPATIENT_CLINIC_OR_DEPARTMENT_OTHER): Payer: Self-pay | Admitting: Emergency Medicine

## 2023-11-22 DIAGNOSIS — R1031 Right lower quadrant pain: Secondary | ICD-10-CM | POA: Diagnosis present

## 2023-11-22 DIAGNOSIS — Z7982 Long term (current) use of aspirin: Secondary | ICD-10-CM | POA: Diagnosis not present

## 2023-11-22 DIAGNOSIS — I10 Essential (primary) hypertension: Secondary | ICD-10-CM | POA: Insufficient documentation

## 2023-11-22 HISTORY — DX: Essential (primary) hypertension: I10

## 2023-11-22 LAB — COMPREHENSIVE METABOLIC PANEL WITH GFR
ALT: 46 U/L — ABNORMAL HIGH (ref 0–44)
AST: 38 U/L (ref 15–41)
Albumin: 4.6 g/dL (ref 3.5–5.0)
Alkaline Phosphatase: 74 U/L (ref 38–126)
Anion gap: 12 (ref 5–15)
BUN: 16 mg/dL (ref 6–20)
CO2: 24 mmol/L (ref 22–32)
Calcium: 9.5 mg/dL (ref 8.9–10.3)
Chloride: 99 mmol/L (ref 98–111)
Creatinine, Ser: 1.05 mg/dL (ref 0.61–1.24)
GFR, Estimated: >60 mL/min (ref >60)
Glucose, Bld: 103 mg/dL — ABNORMAL HIGH (ref 70–99)
Potassium: 4.6 mmol/L (ref 3.5–5.1)
Sodium: 135 mmol/L (ref 135–145)
Total Bilirubin: 0.5 mg/dL (ref 0.0–1.2)
Total Protein: 7.5 g/dL (ref 6.5–8.1)

## 2023-11-22 LAB — URINALYSIS, ROUTINE W REFLEX MICROSCOPIC
Bilirubin Urine: NEGATIVE
Glucose, UA: NEGATIVE mg/dL
Hgb urine dipstick: NEGATIVE
Ketones, ur: NEGATIVE mg/dL
Leukocytes,Ua: NEGATIVE
Nitrite: NEGATIVE
Protein, ur: NEGATIVE mg/dL
Specific Gravity, Urine: 1.025 (ref 1.005–1.030)
pH: 6.5 (ref 5.0–8.0)

## 2023-11-22 LAB — CBC
HCT: 46.2 % (ref 39.0–52.0)
Hemoglobin: 15.8 g/dL (ref 13.0–17.0)
MCH: 28.7 pg (ref 26.0–34.0)
MCHC: 34.2 g/dL (ref 30.0–36.0)
MCV: 83.8 fL (ref 80.0–100.0)
Platelets: 231 10*3/uL (ref 150–400)
RBC: 5.51 MIL/uL (ref 4.22–5.81)
RDW: 13.5 % (ref 11.5–15.5)
WBC: 6.9 10*3/uL (ref 4.0–10.5)
nRBC: 0 % (ref 0.0–0.2)

## 2023-11-22 LAB — LIPASE, BLOOD: Lipase: 43 U/L (ref 11–51)

## 2023-11-22 NOTE — ED Provider Notes (Signed)
 [place] [facility] Provider Note   CSN: [id number] Arrival date & time: 11/22/23  1215     Patient presents with: Abdominal Pain   Alex Fields is a 43 y.o. male.    Abdominal Pain  Patient is a 43 year old male w/ PSHx of appendicitis.  Past medical history of seizures, hypertension he states he does not take his hypertension medication because he is trying to make lifestyle changes   States that for approximately 6 weeks he has had right lower quadrant abdominal pain he states he had appendicitis when he was young he states his pain is constant achy and moderate to severe at times.  Seems to be worse after eating but never goes away completely.  Denies any bowel or bladder incontinence no blood in stool.  No flank pain no blood in his urine.  No nausea vomiting or diarrhea.  No other associated symptoms.      Prior to Admission medications   Medication Sig Start Date End Date Taking? Authorizing Provider  acetaminophen  (TYLENOL ) 325 MG tablet Take 650 mg by mouth every 4 (four) hours as needed for mild pain.     [provider]  aspirin (BAYER LOW DOSE) 81 MG EC tablet Take 81 mg by mouth daily. Swallow whole.    [provider]  Ibuprofen (IBU-200 PO) Take 400 mg by mouth daily as needed (for pain).     [provider]  levETIRAcetam (KEPPRA) 500 MG tablet Take 1,500 mg by mouth in the morning and at bedtime.     [provider]  naproxen  (NAPROSYN ) 500 MG tablet  11/10/19   [provider]    Allergies: Patient has no known allergies.    Review of Systems  Gastrointestinal:  Positive for abdominal pain.    Updated Vital Signs BP (!) 160/121   Pulse 78   Temp 98 F (36.7 C) (Oral)   Resp 18   Ht 5' 11 (1.803 m)   Wt 106.6 kg   SpO2 97%   BMI 32.78 kg/m   Physical Exam Vitals and nursing note reviewed.  Constitutional:      General: He is not in acute distress. HENT:      Head: Normocephalic and atraumatic.     Nose: Nose normal.   Eyes:     General: No scleral icterus.   Cardiovascular:     Rate and Rhythm: Normal rate and regular rhythm.     Pulses: Normal pulses.     Heart sounds: Normal heart sounds.  Pulmonary:     Effort: Pulmonary effort is normal. No respiratory distress.     Breath sounds: No wheezing.  Abdominal:     Palpations: Abdomen is soft.     Tenderness: There is abdominal tenderness in the right lower quadrant.     Comments: No bruising to RLQ .There is a remote/completely healed surgical scar here that is horizontal. Mild TTP here and TTP above right hip bone.   Musculoskeletal:     Cervical back: Normal range of motion.     Right lower leg: No edema.     Left lower leg: No edema.   Skin:    General: Skin is warm and dry.     Capillary Refill: Capillary refill takes less than 2 seconds.   Neurological:     Mental Status: He is alert. Mental status is at baseline.   Psychiatric:        Mood and Affect:  Mood normal.        Behavior: Behavior normal.     (all labs ordered are listed, but only abnormal results are displayed) Labs Reviewed  COMPREHENSIVE METABOLIC PANEL WITH GFR - Abnormal; Notable for the following components:      Result Value   Glucose, Bld 103 (*)    ALT 46 (*)    All other components within normal limits  LIPASE, BLOOD  CBC  URINALYSIS, ROUTINE W REFLEX MICROSCOPIC    EKG: None  Radiology: CT ABDOMEN PELVIS WO CONTRAST Result Date: 11/22/2023 CLINICAL DATA:  Right lower quadrant abdominal pain. EXAM: CT ABDOMEN AND PELVIS WITHOUT CONTRAST TECHNIQUE: Multidetector CT imaging of the abdomen and pelvis was performed following the standard protocol without IV contrast. RADIATION DOSE REDUCTION: This exam was performed according to the departmental dose-optimization program which includes automated exposure control, adjustment of the mA and/or kV according to patient size and/or use of iterative  reconstruction technique. COMPARISON:  None Available. FINDINGS: Evaluation of this exam is limited in the absence of intravenous contrast. Lower chest: The visualized lung bases are clear. No intra-abdominal free air or free fluid. Hepatobiliary: No focal liver abnormality is seen. No gallstones, gallbladder wall thickening, or biliary dilatation. Pancreas: Unremarkable. No pancreatic ductal dilatation or surrounding inflammatory changes. Spleen: Normal in size without focal abnormality. Adrenals/Urinary Tract: The adrenal glands, kidneys, visualized ureters, and urinary bladder appear unremarkable. Stomach/Bowel: Small diverticula at the base of the cecum versus less likely appendectomy stump. There is no bowel obstruction or active inflammation. Vascular/Lymphatic: The abdominal aorta and IVC unremarkable. No portal venous gas. There is no adenopathy. Reproductive: The prostate and seminal vesicles are grossly remarkable. No pelvic mass. Other: Small fat containing umbilical hernia. Musculoskeletal: No acute or significant osseous findings. IMPRESSION: No acute intra-abdominal or pelvic pathology. No hydronephrosis or nephrolithiasis. Electronically Signed   By: Vanetta Chou M.D.   On: 11/22/2023 14:59     Procedures   Medications Ordered in the ED - No data to display                                  Medical Decision Making Amount and/or Complexity of Data Reviewed Labs: ordered. Radiology: ordered.   This patient presents to the ED for concern of abd pain, this involves a number of treatment options, and is a complaint that carries with it a moderate risk of complications and morbidity. A differential diagnosis was considered for the patient's symptoms which is discussed below:   The causes of generalized abdominal pain include but are not limited to AAA, mesenteric ischemia, appendicitis, diverticulitis, DKA, gastritis, gastroenteritis, AMI, nephrolithiasis, pancreatitis, peritonitis,  adrenal insufficiency,lead poisoning, iron toxicity, intestinal ischemia, constipation, UTI,SBO/LBO, splenic rupture, biliary disease, IBD, IBS, PUD, or hepatitis.   Co morbidities: Discussed in HPI   Brief History:  Patient is a 43 year old male w/ PSHx of appendicitis.  Past medical history of seizures, hypertension he states he does not take his hypertension medication because he is trying to make lifestyle changes   States that for approximately 6 weeks he has had right lower quadrant abdominal pain he states he had appendicitis when he was young he states his pain is constant achy and moderate to severe at times.  Seems to be worse after eating but never goes away completely.  Denies any bowel or bladder incontinence no blood in stool.  No flank pain no blood in his  urine.  No nausea vomiting or diarrhea.  No other associated symptoms.    EMR reviewed including pt PMHx, past surgical history and past visits to ER.   See HPI for more details   Lab Tests:   I personally reviewed all laboratory work and imaging. Metabolic panel without any acute abnormality specifically kidney function within normal limits and no significant electrolyte abnormalities. CBC without leukocytosis or significant anemia.   Imaging Studies:  NAD. I personally reviewed all imaging studies and no acute abnormality found. I agree with radiology interpretation.    Cardiac Monitoring:  NA NA   Medicines ordered:    Critical Interventions:     Consults/Attending Physician      Reevaluation:  After the interventions noted above I re-evaluated patient and found that they have :stayed the same   Social Determinants of Health:      Problem List / ED Course:  Patient with reassuring physical exam, reassuring workup reassuring CT abdomen pelvis without contrast.  Recommended close outpatient follow-up with his primary care provider.  He is in agreement with plan.  Return precautions  discussed.   Dispostion:  After consideration of the diagnostic results and the patients response to treatment, I feel that the patent would benefit from discharge with outpatient FU.    Final diagnoses:  Right lower quadrant abdominal pain    ED Discharge Orders     None          Neldon Hamp RAMAN, [place] 11/22/23 2005    Long, Joshua G, MD 11/25/23 [phone]

## 2023-11-22 NOTE — ED Notes (Signed)
 ED Provider at bedside.

## 2023-11-22 NOTE — Discharge Instructions (Signed)
 Please follow-up with your primary care doctor.  Make sure you are drinking plenty of water, your CT scan today is reassuring without any abnormal findings such as masses or evidence of infection.  Please begin taking your blood pressure medication prescribed by your primary care doctor.  Your blood pressure is elevated today.

## 2023-11-22 NOTE — ED Notes (Signed)
 PA at bedside. Patient provided updates.

## 2023-11-22 NOTE — ED Triage Notes (Signed)
 Pt POV steady gait- reports RLQ pain x1.5 months. Pain worse when leaning to R, after eating. Denies swelling, urinary sx, n/v/d.   LBM today, normal.

## 2024-03-24 ENCOUNTER — Other Ambulatory Visit: Payer: Self-pay

## 2024-03-24 ENCOUNTER — Emergency Department (HOSPITAL_BASED_OUTPATIENT_CLINIC_OR_DEPARTMENT_OTHER)
Admission: EM | Admit: 2024-03-24 | Discharge: 2024-03-24 | Disposition: A | Discharge status: Home / Self Care | Attending: Emergency Medicine | Admitting: Emergency Medicine

## 2024-03-24 ENCOUNTER — Emergency Department (HOSPITAL_BASED_OUTPATIENT_CLINIC_OR_DEPARTMENT_OTHER)

## 2024-03-24 ENCOUNTER — Encounter (HOSPITAL_BASED_OUTPATIENT_CLINIC_OR_DEPARTMENT_OTHER): Payer: Self-pay | Admitting: Emergency Medicine

## 2024-03-24 DIAGNOSIS — I10 Essential (primary) hypertension: Secondary | ICD-10-CM | POA: Diagnosis not present

## 2024-03-24 DIAGNOSIS — Q282 Arteriovenous malformation of cerebral vessels: Secondary | ICD-10-CM | POA: Diagnosis not present

## 2024-03-24 DIAGNOSIS — G44209 Tension-type headache, unspecified, not intractable: Secondary | ICD-10-CM | POA: Diagnosis not present

## 2024-03-24 DIAGNOSIS — R030 Elevated blood-pressure reading, without diagnosis of hypertension: Secondary | ICD-10-CM

## 2024-03-24 DIAGNOSIS — R079 Chest pain, unspecified: Secondary | ICD-10-CM | POA: Diagnosis not present

## 2024-03-24 DIAGNOSIS — Z7982 Long term (current) use of aspirin: Secondary | ICD-10-CM | POA: Diagnosis not present

## 2024-03-24 DIAGNOSIS — R519 Headache, unspecified: Secondary | ICD-10-CM | POA: Diagnosis present

## 2024-03-24 LAB — CBC WITH DIFFERENTIAL/PLATELET
Abs Immature Granulocytes: 0.04 K/uL (ref 0.00–0.07)
Basophils Absolute: 0.1 K/uL (ref 0.0–0.1)
Basophils Relative: 1 %
Eosinophils Absolute: 0.1 K/uL (ref 0.0–0.5)
Eosinophils Relative: 2 %
HCT: 47.2 % (ref 39.0–52.0)
Hemoglobin: 16 g/dL (ref 13.0–17.0)
Immature Granulocytes: 1 %
Lymphocytes Relative: 31 %
Lymphs Abs: 2.1 K/uL (ref 0.7–4.0)
MCH: 29 pg (ref 26.0–34.0)
MCHC: 33.9 g/dL (ref 30.0–36.0)
MCV: 85.7 fL (ref 80.0–100.0)
Monocytes Absolute: 0.5 K/uL (ref 0.1–1.0)
Monocytes Relative: 7 %
Neutro Abs: 4 K/uL (ref 1.7–7.7)
Neutrophils Relative %: 58 %
Platelets: 257 K/uL (ref 150–400)
RBC: 5.51 MIL/uL (ref 4.22–5.81)
RDW: 13.5 % (ref 11.5–15.5)
WBC: 6.8 K/uL (ref 4.0–10.5)
nRBC: 0 % (ref 0.0–0.2)

## 2024-03-24 LAB — BASIC METABOLIC PANEL WITH GFR
Anion gap: 11 (ref 5–15)
BUN: 11 mg/dL (ref 6–20)
CO2: 23 mmol/L (ref 22–32)
Calcium: 9.7 mg/dL (ref 8.9–10.3)
Chloride: 103 mmol/L (ref 98–111)
Creatinine, Ser: 1.08 mg/dL (ref 0.61–1.24)
GFR, Estimated: >60 mL/min (ref >60)
Glucose, Bld: 85 mg/dL (ref 70–99)
Potassium: 4.5 mmol/L (ref 3.5–5.1)
Sodium: 137 mmol/L (ref 135–145)

## 2024-03-24 LAB — URINALYSIS, ROUTINE W REFLEX MICROSCOPIC
Bilirubin Urine: NEGATIVE
Glucose, UA: NEGATIVE mg/dL
Hgb urine dipstick: NEGATIVE
Ketones, ur: NEGATIVE mg/dL
Leukocytes,Ua: NEGATIVE
Nitrite: NEGATIVE
Protein, ur: NEGATIVE mg/dL
Specific Gravity, Urine: 1.02 (ref 1.005–1.030)
pH: 7 (ref 5.0–8.0)

## 2024-03-24 LAB — TROPONIN T, HIGH SENSITIVITY: Troponin T High Sensitivity: <15 ng/L (ref 0–19)

## 2024-03-24 MED ORDER — IOHEXOL 350 MG/ML SOLN
80.0000 mL | Freq: Once | INTRAVENOUS | Status: AC | PRN
  Administered 2024-03-24: 75 mL via INTRAVENOUS

## 2024-03-24 MED ORDER — KETOROLAC TROMETHAMINE 15 MG/ML IJ SOLN
15.0000 mg | Freq: Once | INTRAMUSCULAR | Status: AC
  Administered 2024-03-24: 15 mg via INTRAVENOUS
  Filled 2024-03-24: qty 1

## 2024-03-24 NOTE — ED Triage Notes (Signed)
 Chest pain and h/a for past 10 days. Eval by PCP today and told to come to ED for further work up

## 2024-03-24 NOTE — Discharge Instructions (Addendum)
 Follow-up with primary doctor and cardiology for further evaluation. Use Tylenol  every 4 hours as needed for pain. 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  Thank you for the opportunity to take care of you in our Emergency Department. You have been diagnosed with high blood pressure, also known as hypertension. This means that the force of blood against the walls of your blood vessels called is too strong. It also means that your heart has to work harder to move the blood. High blood pressure usually has no symptoms, but over time, it can cause serious health problems such as Heart attack and heart failure Stroke Kidney disease and failure Vision loss With the help from your healthcare provider and some important life style changes, you can manage your blood pressure and protect your health. Please read the instructions provided on hypertension, how to manage it and how to check your blood pressure. Additionally, use the blood pressure log provided to record your blood pressures. Take the blood pressure log with you to your primary care doctor so that they can adjust your blood pressure medications if needed. Please read the instructions on follow-up appointment. Return to the ER or Call 911 right away if you have any of these symptoms: Chest pain or shortness of breath Severe headache Weakness, tingling, or numbness of your face, arms, or legs (especially on 1 side of the body) Sudden change in vision Confusion, trouble speaking, or trouble understanding speech

## 2024-03-24 NOTE — ED Provider Notes (Signed)
 [place] [facility] Provider Note   CSN: [id number] Arrival date & time: 03/24/24  1002     Patient presents with: Chest Pain   Alex Fields is a 43 y.o. male.   Patient presents with intermittent chest discomfort nonradiating transient/brief lasting a few minutes at a time for the past 7 to 10 days.  Nonexertional related.  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  Patient is having increased stress recently multiple life/family stressors.  Patient's had recurrent headache posterior no neurologic signs or symptoms.  Headache different than previous.  Gradual onset.  No travel, tick bites, fevers.  Patient is vaccines up-to-date.  The history is provided by the patient.  Chest Pain Associated symptoms: headache   Associated symptoms: no abdominal pain, no back pain, no fever, no shortness of breath and no vomiting        Prior to Admission medications   Medication Sig Start Date End Date Taking? Authorizing Provider  acetaminophen  (TYLENOL ) 325 MG tablet Take 650 mg by mouth every 4 (four) hours as needed for mild pain.     [provider]  aspirin (BAYER LOW DOSE) 81 MG EC tablet Take 81 mg by mouth daily. Swallow whole.    [provider]  Ibuprofen (IBU-200 PO) Take 400 mg by mouth daily as needed (for pain).     [provider]  levETIRAcetam (KEPPRA) 500 MG tablet Take 1,500 mg by mouth in the morning and at bedtime.     [provider]  naproxen  (NAPROSYN ) 500 MG tablet  11/10/19   [provider]    Allergies: Patient has no known allergies.    Review of Systems  Constitutional:  Negative for chills and fever.  HENT:  Negative for congestion.   Eyes:  Negative for visual disturbance.  Respiratory:  Negative for shortness of breath.   Cardiovascular:  Positive for chest pain.  Gastrointestinal:  Negative for abdominal pain and vomiting.  Genitourinary:  Negative for  flank pain.  Musculoskeletal:  Negative for back pain, neck pain and neck stiffness.  Skin:  Negative for rash.  Neurological:  Positive for headaches. Negative for light-headedness.    Updated Vital Signs BP (!) 151/106   Pulse 80   Temp 97.6 F (36.4 C)   Resp 17   SpO2 97%   Physical Exam Vitals and nursing note reviewed.  Constitutional:      General: He is not in acute distress.    Appearance: He is well-developed.  HENT:     Head: Normocephalic and atraumatic.     Mouth/Throat:     Mouth: Mucous membranes are moist.  Eyes:     General:        Right eye: No discharge.        Left eye: No discharge.     Conjunctiva/sclera: Conjunctivae normal.  Neck:     Trachea: No tracheal deviation.  Cardiovascular:     Rate and Rhythm: Normal rate and regular rhythm.     Heart sounds: Heart sounds not distant.  Pulmonary:     Effort: Pulmonary effort is normal.     Breath sounds: Normal breath sounds.  Abdominal:     General: There is no distension.     Palpations: Abdomen is soft.     Tenderness: There is no abdominal tenderness. There is no guarding.  Musculoskeletal:     Cervical back: Normal range of motion and neck supple. No rigidity.  Right lower leg: No edema.     Left lower leg: No edema.  Skin:    General: Skin is warm.     Capillary Refill: Capillary refill takes less than 2 seconds.     Findings: No rash.  Neurological:     General: No focal deficit present.     Mental Status: He is alert.     GCS: GCS eye subscore is 4. GCS verbal subscore is 5. GCS motor subscore is 6.     Cranial Nerves: No cranial nerve deficit.     Motor: No weakness or seizure activity.  Psychiatric:        Mood and Affect: Mood normal.     (all labs ordered are listed, but only abnormal results are displayed) Labs Reviewed  BASIC METABOLIC PANEL WITH GFR  CBC WITH DIFFERENTIAL/PLATELET  URINALYSIS, ROUTINE W REFLEX MICROSCOPIC  TROPONIN T, HIGH SENSITIVITY    EKG: EKG  Interpretation Date/Time:  Friday March 24 2024 10:16:12 EDT Ventricular Rate:  76 PR Interval:  176 QRS Duration:  93 QT Interval:  365 QTC Calculation: 411 R Axis:   -1  Text Interpretation: Sinus rhythm ST elev, probable normal early repol pattern Confirmed by Tonia Chew [phone]) on 03/24/2024 10:30:38 AM  Radiology: CT Angio Head W or Wo Contrast Result Date: 03/24/2024 EXAM: CTA Head with and without Intravenous Contrast CLINICAL HISTORY: headache, concern for AVM TECHNIQUE: Axial CTA images of the head with and without intravenous contrast. MIP reconstructed images were created and reviewed. Dose reduction technique was used including one or more of the following: automated exposure control, adjustment of mA and kV according to patient size, and/or iterative reconstruction. CONTRAST: With and without; 75 mL (iohexol (OMNIPAQUE) 350 MG/ML injection 80 mL IOHEXOL 350 MG/ML SOLN) COMPARISON: Head CT 03/24/2024 FINDINGS: INTERNAL CAROTID ARTERIES: The intracranial ICAs are patent with no significant stenosis. No occlusion. No aneurysm. ANTERIOR CEREBRAL ARTERIES: No significant stenosis. No occlusion. No aneurysm. MIDDLE CEREBRAL ARTERIES: No significant stenosis. No occlusion. No aneurysm. POSTERIOR CEREBRAL ARTERIES: No significant stenosis. No occlusion. No aneurysm. BASILAR ARTERY: No significant stenosis. No occlusion. No aneurysm. VERTEBRAL ARTERIES: No significant stenosis. No occlusion. No aneurysm. BRAIN: Venous drainage from the area of calcification at the posterior right temporal lobe is via a draining vein that connects to the diminutive right transverse sinus. Unclear arterial supply. This measures approximately 1.0 x 0.9 x 0.8 cm, though the irregular shape may limit the usefulness of this measurement. SOFT TISSUES: No acute finding. No masses or lymphadenopathy. BONES: No acute osseous abnormality. IMPRESSION: 1. Posterior right temporal vascular malformation measuring  approximately 1.0 x 0.9 x 0.8 cm. No arterial supply is clearly identified. Venous drainage into the diminutive right transverse sinus. 2. No evidence of significant stenosis, aneurysmal dilatation, or dissection involving the arteries of the head. Electronically signed by: Franky Stanford MD 03/24/2024 01:19 PM EDT RP Workstation: [id number]   DG Chest 2 View Result Date: 03/24/2024 EXAM: 2 VIEW(S) XRAY OF THE CHEST 03/24/2024 11:23:47 AM COMPARISON: 11/10/2019 CLINICAL HISTORY: CP FINDINGS: LUNGS AND PLEURA: No focal pulmonary opacity. No pulmonary edema. No pleural effusion. No pneumothorax. HEART AND MEDIASTINUM: No acute abnormality of the cardiac and mediastinal silhouettes. BONES AND SOFT TISSUES: No acute osseous abnormality. IMPRESSION: 1. Normal chest radiograph. Electronically signed by: Franky Stanford MD 03/24/2024 11:29 AM EDT RP Workstation: [id number]   CT Head Wo Contrast Result Date: 03/24/2024 EXAM: CT HEAD WITHOUT CONTRAST 03/24/2024 11:21:55 AM TECHNIQUE: CT of the head was performed without the  administration of intravenous contrast. Automated exposure control, iterative reconstruction, and/or weight based adjustment of the mA/kV was utilized to reduce the radiation dose to as low as reasonably achievable. COMPARISON: None available. CLINICAL HISTORY: headache FINDINGS: BRAIN AND VENTRICLES: No acute hemorrhage. No evidence of acute infarct. No hydrocephalus. No extra-axial collection. No mass effect or midline shift. Curvilinear cortical calcifications along the peripheral right occipital lobe. ORBITS: No acute abnormality. SINUSES: Mild mucosal thickening within the inferior maxillary sinuses. Opacified ethmoid air cells. SOFT TISSUES AND SKULL: No acute soft tissue abnormality. No skull fracture. IMPRESSION: 1. No acute intracranial abnormality. 2. Mild paranasal sinus disease, including opacified ethmoid air cells and mild inferior maxillary sinus mucosal thickening. 3. Right occipital  lobe cortical calcifications possibly a result of remote ischemia or the presence of an arteriovenous malformation. Electronically signed by: Franky Stanford MD 03/24/2024 11:28 AM EDT RP Workstation: [id number]     Procedures   Medications Ordered in the ED  ketorolac (TORADOL) 15 MG/ML injection 15 mg (15 mg Intravenous Given 03/24/24 1056)  iohexol (OMNIPAQUE) 350 MG/ML injection 80 mL (75 mLs Intravenous Contrast Given 03/24/24 1224)                                    Medical Decision Making Amount and/or Complexity of Data Reviewed Labs: ordered. Radiology: ordered.  Risk Prescription drug management.   Patient presents with 2 concerns. Headache recurrent for 10 days different than previous without trauma or infectious signs or symptoms.  CT scan ordered and discussed continued outpatient follow-up if symptoms persist.  Toradol ordered for pain.  Normal neurologic exam no indication at this time for MRI of the brain or lumbar puncture.  Patient's had recurrent chest pain atypical and patient low risk.  Plan for blood work and single troponin and outpatient follow-up with cardiology.  Differential includes anxiety/stress, musculoskeletal, GERD, ACS, other.  Patient comfortable plan.  CT scan results independently reviewed showing abnormality occipital region.  CT angiogram results independent reviewed confirming AV malformation no other acute abnormalities.  Discussed with Dr. Cabbel will follow-up the patient outpatient.  Patient troponin negative, general blood work unremarkable.  Patient will need follow-up for elevated blood pressure in the ER.     Final diagnoses:  Acute non intractable tension-type headache  Acute chest pain  AVM (arteriovenous malformation) brain  Elevated blood pressure reading  Uncontrolled hypertension    ED Discharge Orders          Ordered    Ambulatory referral to Cardiology       Comments: If you have not heard from the Cardiology office  within the next 72 hours please call [phone].   03/24/24 1059               Tonia Chew, MD 03/24/24 1440

## 2024-03-24 NOTE — ED Notes (Signed)

## 2024-03-28 NOTE — Progress Notes (Deleted)
 Cardiology [facility]:    Date:  03/28/2024   ID:  Alex Fields, DOB 08/27/1980, MRN [id number]  PCP:  Reita Locus, MD  Cardiologist:  None { Click to update primary MD,subspecialty MD or APP then REFRESH:1}    Referring MD: Tonia Chew, MD   Chief Complaint: chest pain  History of Present Illness:    Alex Fields is a 43 y.o. 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   Patient was admitted in 02/2017 at [facility] in [place], [place]  for NSTEMI after presenting with chest pain. LHC showed normal coronaries. Echo showed LVEF of 55% with normal wall motion and diastolic parameters, normal RV function, and no significant valvular disease. V/Q scan was negative for PE. Cardiac MRI was consistent with myocarditis with associated decreased LV function (LVEF 34%), mildly decreased RV function, and a small to moderate pericardial effusion. He was treated with Colchicine and Ibuprofen.  Patient was seen by PCP on 03/24/2024 and reported chest pain and headaches for 1 week. She was advised to go to the ED. In the ED, EKG showed normal sinus rhythm, rate 76 bpm, with slight J point elevation and upsloping ST segments not meeting criteria for STEMI and suggestive of early repolarization. High-sensitivity troponin negative. Head CT showed no acute intracranial abnormalities but a right occipital lobe cortical calcifications suggestive of possible remote ischemia or the presence of an AVM. Head CTA showed a posterior right temporal vascular malformation. Chest pain was felt to be atypical. He was given Toradol for his headache and felt to be stable for discharge. He was referred to Cardiology and Neurosurgery as an outpatient.   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 LHC at that time showed normal coronaries. He was treated with Colchicine and Ibuprofen at that time. *** EKG during recent ED showed normal sinus rhythm with slight J point elevation and upsloping ST segments in multiple leads consistent with early repolarization. High-sensitivity troponin was negative.  - *** - Will order a coronary CTA. *** - Will also order an Echo. Cardiac MRI in 2018 at time of diagnosis of myocarditis also showed decreased LV function with LVEF of 34% (although EF was 55% on Echo at that time), mildly decreased RV function, and a small to moderate pericardial effusion (not seen on Echo at that time). Repeat Echo will allow us  to reassess LV function and pericardial effusion. ***  Hypertension BP ***  Hyperlipidemia Lipid panel in 11/2023: Total Cholesterol 244, Triglycerides 241, HDL 48, LDL 152.  - ***  EKGs/Labs/Other Studies Reviewed:    The following studies were reviewed:  Echocardiogram 03/22/2017 [facility]): Summary: - LEFT VENTRICLE: Size was normal. Systolic function was normal. Ejection fraction was estimated to be 55 %. There were no regional wall motion abnormalities. Wall thickness was moderately increased. DOPPLER: Left ventricular diastolic  function parameters were normal.  - RIGHT VENTRICLE: The size was normal. Systolic function was normal.  - LEFT ATRIUM: Size was normal.  - ATRIAL SEPTUM: No defect or patent foramen ovale was identified by color Doppler.  - RIGHT ATRIUM: Size was normal.  - MITRAL VALVE: Valve structure was normal. There was normal leaflet separation. DOPPLER: The transmitral velocity was within the normal range. There was  no evidence for stenosis. There was trace regurgitation.  - AORTIC VALVE: The valve was trileaflet. Leaflets exhibited normal thickness and normal cuspal separation. DOPPLER:  Transaortic velocity was within the normal range.  There was no evidence for stenosis. There was no regurgitation.  - TRICUSPID VALVE: The valve structure was normal. There was normal leaflet separation. DOPPLER: There was no evidence for stenosis. There was trace regurgitation.  - PULMONIC VALVE: Leaflets exhibited normal thickness, no calcification, and normal cuspal separation. DOPPLER: There was no regurgitation.  - AORTA: The root exhibited normal size.  - SYSTEMIC VEINS: IVC: The inferior vena cava was normal in size. Inspiratory collapse was greater than 50%.  - PULMONARY ARTERY: DOPPLER: Systolic pressure was within the normal range.  - PERICARDIUM: There was no pericardial effusion. The pericardium was normal in appearance.  _______________  Cardiac MRI 03/24/2017([facility]): Summary: 1.  Diffuse abnormal mid myocardial signal and delayed  enhancement with relative sparing of the lateral wall at the  ventricle base is consistent with myocarditis which is associated  with decreased left ventricular function (LVEF estimated at 34%).  2.  Right ventricular function appears mildly decreased.  3.  Small to moderate pericardial effusion appears present. No  pericardial enhancement is identified currently.    EKG:  EKG not ordered today. Recent EKG from ED visit on 03/24/2024 was personally reviewed and demonstrates normal sinus rhythm with slight J point elevation and upsloping ST segments in multiple leads consistent with early repolarization..  Recent Labs: 11/22/2023: ALT 46 03/24/2024: BUN 11; Creatinine, Ser 1.08; Hemoglobin 16.0; Platelets 257; Potassium 4.5; Sodium 137  Recent Lipid Panel No results found for: CHOL, TRIG, HDL, CHOLHDL, VLDL, LDLCALC, LDLDIRECT  Physical Exam:    Vital Signs: There were no vitals taken for this visit.    Wt Readings from Last 3 Encounters:  11/22/23 235 lb (106.6 kg)  12/11/22 237 lb (107.5 kg)  11/10/19 245 lb (111.1 kg)     General: 43 y.o. male in no acute  distress. HEENT: Normocephalic and atraumatic. Sclera clear.  Neck: Supple. No carotid bruits. No JVD. Heart: *** RRR. Distinct S1 and S2. No murmurs, gallops, or rubs.  Lungs: No increased work of breathing. Clear to ausculation bilaterally. No wheezes, rhonchi, or rales.  Abdomen: Soft, non-distended, and non-tender to palpation.  Extremities: No lower extremity edema.  Radial and distal pedal pulses 2+ and equal bilaterally. Skin: Warm and dry. Neuro: No focal deficits. Psych: Normal affect. Responds appropriately.   Assessment:    No diagnosis found.  Plan:     Disposition: Follow up in ***   Signed, Aline FORBES Door, PA-C  03/28/2024 4:52 PM    [facility]

## 2024-03-29 ENCOUNTER — Ambulatory Visit: Attending: Student | Admitting: Student

## 2024-03-30 ENCOUNTER — Encounter: Payer: Self-pay | Admitting: Student

## 2024-04-10 ENCOUNTER — Ambulatory Visit: Admitting: Student

## 2024-04-12 NOTE — Progress Notes (Unsigned)
 Cardiology Clinic Note   Patient Name: Alex Fields Date of Encounter: 04/13/2024  Primary Care Provider:  Reita Locus, MD Primary Cardiologist:  None  Patient Profile    Alex Fields 43 year old male presents to the clinic today for evaluation of his chest pain.  Past Medical History    Past Medical History:  Diagnosis Date   Hypertension    Seizures [facility])    Past Surgical History:  Procedure Laterality Date   ACHILLES TENDON SURGERY Right 10/17/2019   Procedure: PRIMARY RIGHT ACHILLES TENDON REPAIR;  Surgeon: Vernetta Lonni GRADE, MD;  Location: [facility] OR;  Service: Orthopedics;  Laterality: Right;   ANTERIOR CRUCIATE LIGAMENT REPAIR     APPENDECTOMY     FRACTURE SURGERY Right    wrist    SHOULDER SURGERY      Allergies  No Known Allergies  History of Present Illness    Alex Fields has a PMH of hypertension, chest pain, and right Achilles tendon rupture.  He was seen and evaluated in the emergency department on 03/24/2024.  He reported intermittent chest discomfort that was nonradiating.  He reported that his pain was brief and would last for a few minutes.  The pain has been present for 7-10 days.  He denied exertional chest pain.  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  He had recovered well.  He noted increased stress related to family stressors.  He also reported a headache that had presented with gradual onset.  He denied recent travel, insect bites, and fevers.  His blood pressure was noted to be 151/106.  His EKG showed sinus rhythm, ST elevation, probable normal early repolarization patterns 76 bpm  His cardiac troponins were negative.  His head CT showed no significant stenosis, aneurysmal dilation, or dissection.  He was referred to cardiology for follow-up.  He presents to the clinic today for evaluation and states he is noticing an uneasiness in his chest today.  He reports that on the pain scale it is about a 2 out of 10.  He climbs  big tanks for work and occasionally notices chest discomfort with increased physical activity.  We reviewed his emergency department visit and his blood work.  He expressed understanding.  He reports that he does have a family history of heart disease on his maternal side with his mother having hypertension and also having diabetes.  We reviewed his blood pressure.  It is 142/100 today.  He reports that he has not taken HCTZ.  He has been trying to manage his blood pressure with diet and exercise.  I encouraged him to resume his HCTZ 25 mg daily.  I will order lipid panel, magnesium, echocardiogram and coronary CTA.  Will plan follow-up in 2-3 months.  I will also give him the salty 6 diet sheet in mindfulness stress reduction sheet.  Today he denies  melena, hematuria, hemoptysis, diaphoresis, weakness, presyncope, syncope, orthopnea, and PND.    Home Medications    Prior to Admission medications   Medication Sig Start Date End Date Taking? Authorizing Provider  acetaminophen  (TYLENOL ) 325 MG tablet Take 650 mg by mouth every 4 (four) hours as needed for mild pain.     [provider]  aspirin (BAYER LOW DOSE) 81 MG EC tablet Take 81 mg by mouth daily. Swallow whole.    [provider]  Ibuprofen (IBU-200 PO) Take 400 mg by mouth daily as needed (for pain).     [provider]  levETIRAcetam (  KEPPRA) 500 MG tablet Take 1,500 mg by mouth in the morning and at bedtime.     [provider]  naproxen  (NAPROSYN ) 500 MG tablet  11/10/19   [provider]    Family History    History reviewed. No pertinent family history. has no family status information on file.   Social History    Social History   Socioeconomic History   Marital status: Married    Spouse name: Not on file   Number of children: Not on file   Years of education: Not on file   Highest education level: Not on file  Occupational History   Not on file  Tobacco Use   Smoking status:  Never   Smokeless tobacco: Never  Vaping Use   Vaping status: Never Used  Substance and Sexual Activity   Alcohol use: Yes    Comment: social   Drug use: Never   Sexual activity: Not on file  Other Topics Concern   Not on file  Social History Narrative   Not on file   Social Drivers of Health   Financial Resource Strain: Low Risk  (12/27/2023)   Received from [facility]   Overall Financial Resource Strain (CARDIA)    How hard is it for you to pay for the very basics like food, housing, medical care, and heating?: Not hard at all  Food Insecurity: Low Risk  (03/20/2024)   Received from [facility]   Hunger Vital Sign    Within the past 12 months, you worried that your food would run out before you got money to buy more: Never true    Within the past 12 months, the food you bought just didn't last and you didn't have money to get more. : Never true  Transportation Needs: No Transportation Needs (03/20/2024)   Received from [facility]    In the past 12 months, has lack of reliable transportation kept you from medical appointments, meetings, work or from getting things needed for daily living? : No  Physical Activity: Sufficiently Active (12/27/2023)   Received from [facility]   Exercise Vital Sign    On average, how many days per week do you engage in moderate to strenuous exercise (like a brisk walk)?: 5 days    On average, how many minutes do you engage in exercise at this level?: 30 min  Stress: No Stress Concern Present (12/27/2023)   Received from [facility] - Occupational Stress Questionnaire    Do you feel stress - tense, restless, nervous, or anxious, or unable to sleep at night because your mind is troubled all the time - these days?: Not at all  Social Connections: Socially Integrated (12/27/2023)   Received from [facility]   Social Network    How would you rate your social network (family, work,  friends)?: Good participation with social networks  Intimate Partner Violence: Not At Risk (12/27/2023)   Received from [facility]   HITS    Over the last 12 months how often did your partner physically hurt you?: Never    Over the last 12 months how often did your partner insult you or talk down to you?: Rarely    Over the last 12 months how often did your partner threaten you with physical harm?: Never    Over the last 12 months how often did your partner scream or curse at you?: Never     Review  of Systems    General:  No chills, fever, night sweats or weight changes.  Cardiovascular:  No chest pain, dyspnea on exertion, edema, orthopnea, palpitations, paroxysmal nocturnal dyspnea. Dermatological: No rash, lesions/masses Respiratory: No cough, dyspnea Urologic: No hematuria, dysuria Abdominal:   No nausea, vomiting, diarrhea, bright red blood per rectum, melena, or hematemesis Neurologic:  No visual changes, wkns, changes in mental status. All other systems reviewed and are otherwise negative except as noted above.  Physical Exam    VS:  BP (!) 142/104   Pulse 88   Ht 5' 11 (1.803 m)   Wt 229 lb (103.9 kg)   SpO2 98%   BMI 31.94 kg/m  , BMI Body mass index is 31.94 kg/m. GEN: Well nourished, well developed, in no acute distress. HEENT: normal. Neck: Supple, no JVD, carotid bruits, or masses. Cardiac: RRR, no murmurs, rubs, or gallops. No clubbing, cyanosis, edema.  Radials/DP/PT 2+ and equal bilaterally.  Respiratory:  Respirations regular and unlabored, clear to auscultation bilaterally. GI: Soft, nontender, nondistended, BS + x 4. MS: no deformity or atrophy. Skin: warm and dry, no rash. Neuro:  Strength and sensation are intact. Psych: Normal affect.  Accessory Clinical Findings    Recent Labs: 11/22/2023: ALT 46 03/24/2024: BUN 11; Creatinine, Ser 1.08; Hemoglobin 16.0; Platelets 257; Potassium 4.5; Sodium 137   Recent Lipid Panel No results found for: CHOL,  TRIG, HDL, CHOLHDL, VLDL, LDLCALC, LDLDIRECT  HYPERTENSION CONTROL Vitals:   04/13/24 0809 04/13/24 0843  BP: (!) 142/108 (!) 142/104    The patient's blood pressure is elevated above target today.  In order to address the patient's elevated BP: Blood pressure will be monitored at home to determine if medication changes need to be made. (Resume HCTZ 25 mg daily)       ECG personally reviewed by me today- EKG Interpretation Date/Time:  Thursday April 13 2024 08:15:22 EST Ventricular Rate:  88 PR Interval:  156 QRS Duration:  80 QT Interval:  338 QTC Calculation: 408 R Axis:   -26  Text Interpretation: Normal sinus rhythm Possible Left atrial enlargement Left ventricular hypertrophy ( R in aVL , Cornell product ) When compared with ECG of 24-Mar-2024 10:16, PREVIOUS ECG IS PRESENT Confirmed by Emelia Hazy [id number]) on 04/13/2024 8:17:54 AM         Assessment & Plan   1.  Chest pain-reports mild uneasiness today.  Notes that his chest discomfort he would rate 2 out of 3.  Does note occasional exertional chest discomfort with work-related activities.  Previously noted chest discomfort that was intermittent and nonradiating.   He does report a prior history of myocarditis that occurred 7 to 8 years ago.   Heart healthy low-sodium diet Order coronary CTA Order echocardiogram Order Lipids  Essential hypertension-BP today 142/104.  During recent emergency department visit his blood pressure was noted to be 151/106. Maintain blood pressure log Secondary causes of hypertension reviewed Continue hydrochlorothiazide-had not taken yet today.  Encouraged to resume medication   Disposition: Follow-up with Dr.Segal or me in 2 months.   Hazy HERO. Zyasia Halbleib NP-C     04/13/2024, 8:43 AM [facility] [address] 5[address] Floor [place], [place] [address] Office [phone]    Notice: This dictation was prepared with Dragon dictation along  with smaller phrase technology. Any transcriptional errors that result from this process are unintentional and may not be corrected upon review.   I spent 14 minutes examining this patient, reviewing medications, and using patient  centered shared decision making involving their cardiac care.   I spent  20 minutes reviewing past medical history,  medications, and prior cardiac tests.

## 2024-04-13 ENCOUNTER — Ambulatory Visit: Attending: General Practice | Admitting: General Practice

## 2024-04-13 ENCOUNTER — Encounter: Payer: Self-pay | Admitting: General Practice

## 2024-04-13 VITALS — BP 142/104 | HR 88 | Ht 71.0 in | Wt 229.0 lb

## 2024-04-13 DIAGNOSIS — R072 Precordial pain: Secondary | ICD-10-CM

## 2024-04-13 DIAGNOSIS — I10 Essential (primary) hypertension: Secondary | ICD-10-CM

## 2024-04-13 DIAGNOSIS — R079 Chest pain, unspecified: Secondary | ICD-10-CM | POA: Diagnosis not present

## 2024-04-13 MED ORDER — HYDROCHLOROTHIAZIDE 25 MG PO TABS: 25.0000 mg | ORAL_TABLET | Freq: Every day | ORAL | 3 refills | Status: AC

## 2024-04-13 MED ORDER — METOPROLOL TARTRATE 100 MG PO TABS: 100.0000 mg | ORAL_TABLET | Freq: Once | ORAL | 0 refills | Status: DC

## 2024-04-13 NOTE — Patient Instructions (Addendum)
 Medication Instructions:  Your physician has recommended you make the following change in your medication:   START Hydrochlorothiazide 25 mg taking 1 daily  *If you need a refill on your cardiac medications before your next appointment, please call your pharmacy*  Lab Work: TODAY:  LIPID & MAGNESIUM  If you have labs (blood work) drawn today and your tests are completely normal, you will receive your results only by: MyChart Message (if you have MyChart) OR A paper copy in the mail If you have any lab test that is abnormal or we need to change your treatment, we will call you to review the results.  Testing/Procedures: Your physician has requested that you have an echocardiogram. Echocardiography is a painless test that uses sound waves to create images of your heart. It provides your doctor with information about the size and shape of your heart and how well your heart's chambers and valves are working. This procedure takes approximately one hour. There are no restrictions for this procedure. Please do NOT wear cologne, perfume, aftershave, or lotions (deodorant is allowed). Please arrive 15 minutes prior to your appointment time.  Please note: We ask at that you not bring children with you during ultrasound (echo/ vascular) testing. Due to room size and safety concerns, children are not allowed in the ultrasound rooms during exams. Our front office staff cannot provide observation of children in our lobby area while testing is being conducted. An adult accompanying a patient to their appointment will only be allowed in the ultrasound room at the discretion of the ultrasound technician under special circumstances. We apologize for any inconvenience.    Your physician has requested that you have cardiac CT. Cardiac computed tomography (CT) is a painless test that uses an x-ray machine to take clear, detailed pictures of your heart. For further information please visit [email or web address]  Please follow instruction sheet BELOW:    Your cardiac CT will be scheduled at one of the below locations:   [facility] [address] [place], [place] [address] [phone] (Severe contrast allergies only)  OR   [facility] [address] [place], [place] [address] [phone]  OR   [facility] [address] [place], [place] [address] [phone]  OR   [facility] and Vascular Tower [address]  [place], [place] [address]  OR   [facility] [address] [place], [place] [phone]  If scheduled at [facility], please arrive at the [facility] and Children's Entrance (Entrance C2) of [facility] 30 minutes prior to test start time. You can use the FREE valet parking offered at entrance C (encouraged to control the heart rate for the test)  Proceed to the [facility] Radiology Department (first floor) to check-in and test prep.  All radiology patients and guests should use entrance C2 at [facility], accessed from [facility], even though the hospital's physical address listed is [address]  If scheduled at the Heart and Vascular Tower at [address], please enter the parking lot using the [address] entrance and use the FREE valet service at the patient drop-off area. Enter the building and check-in with registration on the main floor.  If scheduled at [facility], please arrive to the Heart and Vascular Center 15 mins early for check-in and test prep.  There is spacious parking and easy access to the radiology department from the [facility] Heart and Vascular  entrance. Please enter here and check-in with the desk attendant.   If scheduled at [facility], please arrive 30 minutes early for check-in and test prep.  Please follow these instructions carefully (unless otherwise directed):  An IV will be required  for this test and Nitroglycerin will be given.  Hold all erectile dysfunction medications at least 3 days (72 hrs) prior to test. (Ie viagra, cialis, sildenafil, tadalafil, etc)   On the Night Before the Test: Be sure to Drink plenty of water. Do not consume any caffeinated/decaffeinated beverages or chocolate 12 hours prior to your test. Do not take any antihistamines 12 hours prior to your test.    On the Day of the Test: Drink plenty of water until 1 hour prior to the test. Do not eat any food 1 hour prior to test. You may take your regular medications prior to the test.  Take metoprolol (Lopressor) 100 MG two hours prior to test.  THIS HAS BEEN SENT TO [facility]  Hydrochlorothiazide THE MORNING OF YOUR TEST   After the Test: Drink plenty of water. After receiving IV contrast, you may experience a mild flushed feeling. This is normal. On occasion, you may experience a mild rash up to 24 hours after the test. This is not dangerous. If this occurs, you can take Benadryl 25 mg, Zyrtec, Claritin, or Allegra and increase your fluid intake. (Patients taking Tikosyn should avoid Benadryl, and may take Zyrtec, Claritin, or Allegra) If you experience trouble breathing, this can be serious. If it is severe call 911 IMMEDIATELY. If it is mild, please call our office.  We will call to schedule your test 2-4 weeks out understanding that some insurance companies will need an authorization prior to the service being performed.   For more information and frequently asked questions, please visit our website : [email or web address]  For non-scheduling related questions, please contact the cardiac imaging nurse navigator should you have any questions/concerns: Cardiac Imaging Nurse Navigators Direct Office Dial: [phone]   For scheduling needs, including cancellations and rescheduling, please call Brittany, [phone].   Follow-Up: At [facility], you and your health  needs are our priority.  As part of our continuing mission to provide you with exceptional heart care, our providers are all part of one team.  This team includes your primary Cardiologist (physician) and Advanced Practice Providers or APPs (Physician Assistants and Nurse Practitioners) who all work together to provide you with the care you need, when you need it.  Your next appointment:   2 month(s)  Provider:   Josefa Beauvais, NP          We recommend signing up for the patient portal called MyChart.  Sign up information is provided on this After Visit Summary.  MyChart is used to connect with patients for Virtual Visits (Telemedicine).  Patients are able to view lab/test results, encounter notes, upcoming appointments, etc.  Non-urgent messages can be sent to your provider as well.   To learn more about what you can do with MyChart, go to [email or web address].   Other Instructions PLEASE READ AND FOLLOW ATTACHED  SALTY 6       Mindfulness-Based Stress Reduction: What to Know Mindfulness-based stress reduction (MBSR) is a mindfulness meditation program that normally takes place over 8 weeks. It usually includes weekly group classes and daily exercises to do at home. What are the benefits of MBSR? Mindfulness meditation therapies, like MBSR, can change a person's brain and body in good ways, and make  them healthier. MBSR can have many benefits, such as: Helping to lower stress hormones. Decreasing symptoms or helping to deal with symptoms of different conditions, like: Anxiety, which is feeling worried or nervous. Long-lasting pain. This is pain that lasts more than 3 months. Stress and worry. Trouble sleeping. Headaches, like migraines and tension headaches. Irritable bowel syndrome. Helping to handle stress from things you can't control, like: Long-term illnesses, especially if you have a lot of pain or other difficult symptoms. Big life events. Stress at work. Stress from  taking care of someone else. Types of MBSR exercises Mindfulness. This is a common type of meditation. Meditation. It helps you focus your mind to feel calm and happy. It has two main parts: paying attention and accepting. Paying attention means focusing on what is happening right now. This usually means noticing your breathing, your thoughts, how your body feels, and your emotions. Accepting means noticing these feelings and sensations without judging them. Instead of reacting to these thoughts or feelings, you just observe them and let them pass. MSBR exercises include: Body scanning. This is a mindfulness exercise where you pay attention to how different parts of your body feel. You can do this while lying down or sitting up. Sitting meditations. In this exercise, you focus on something like your breathing. When your mind starts to wander, gently bring it back to your breath. Keep doing this every time you notice your mind wandering. Mindful movements. This exercise involves moving and stretching your body slowly while paying attention to how it feels. Mindful Tasks. This means paying attention to how your body feels while doing things like walking or eating. Follow these instructions at home:  Find an in-person MBSR program or find a program that is online. Find a podcast or recording that provides guidance for MSBR exercises. Look for a therapist who knows how to use MBSR. Follow your treatment plan as told by your health care provider. This may include taking regular medicines and making changes to your diet or lifestyle. Where to find more information You can find more information about MBSR from: Your provider. Community-based meditation centers or programs. American Psychological Association at http://forbes-duran.com/. This information is not intended to replace advice given to you by your health care provider. Make sure you discuss any questions you have  with your health care provider. Document Revised: 07/15/2023 Document Reviewed: 07/15/2023 Elsevier Patient Education  2025 [employer].

## 2024-04-14 ENCOUNTER — Ambulatory Visit: Payer: Self-pay | Admitting: General Practice

## 2024-04-14 DIAGNOSIS — E7800 Pure hypercholesterolemia, unspecified: Secondary | ICD-10-CM

## 2024-04-14 LAB — LIPID PANEL
Chol/HDL Ratio: 5.3 ratio — ABNORMAL HIGH (ref 0.0–5.0)
Cholesterol, Total: 291 mg/dL — ABNORMAL HIGH (ref 100–199)
HDL: 55 mg/dL (ref >39)
LDL Chol Calc (NIH): 200 mg/dL — ABNORMAL HIGH (ref 0–99)
Triglycerides: 192 mg/dL — ABNORMAL HIGH (ref 0–149)
VLDL Cholesterol Cal: 36 mg/dL (ref 5–40)

## 2024-04-14 LAB — MAGNESIUM: Magnesium: 2.3 mg/dL (ref 1.6–2.3)

## 2024-04-14 MED ORDER — ATORVASTATIN CALCIUM 10 MG PO TABS: 10.0000 mg | ORAL_TABLET | Freq: Every day | ORAL | 3 refills | Status: DC

## 2024-04-14 NOTE — Progress Notes (Signed)
 The patient has been notified of the result and verbalized understanding. All questions (if any) were answered.   Send in Atorvastatin  10 daily, sent lipids order and they have been release.

## 2024-05-02 ENCOUNTER — Encounter (HOSPITAL_COMMUNITY): Payer: Self-pay

## 2024-05-04 ENCOUNTER — Ambulatory Visit (HOSPITAL_COMMUNITY): Admission: RE | Admit: 2024-05-04 | Discharge: 2024-05-04 | Attending: Internal Medicine

## 2024-05-04 DIAGNOSIS — I10 Essential (primary) hypertension: Secondary | ICD-10-CM | POA: Insufficient documentation

## 2024-05-04 DIAGNOSIS — R072 Precordial pain: Secondary | ICD-10-CM | POA: Diagnosis present

## 2024-05-04 DIAGNOSIS — R079 Chest pain, unspecified: Secondary | ICD-10-CM | POA: Insufficient documentation

## 2024-05-04 MED ORDER — NITROGLYCERIN 0.4 MG SL SUBL
0.8000 mg | SUBLINGUAL_TABLET | Freq: Once | SUBLINGUAL | Status: AC
  Administered 2024-05-04: 0.8 mg via SUBLINGUAL

## 2024-05-04 MED ORDER — IOHEXOL 350 MG/ML SOLN
100.0000 mL | Freq: Once | INTRAVENOUS | Status: AC | PRN
  Administered 2024-05-04: 100 mL via INTRAVENOUS

## 2024-05-24 ENCOUNTER — Ambulatory Visit (HOSPITAL_COMMUNITY)
Admission: RE | Admit: 2024-05-24 | Discharge: 2024-05-24 | Disposition: A | Discharge status: Home / Self Care | Source: Ambulatory Visit | Attending: Student in an Organized Health Care Education/Training Program | Admitting: Student in an Organized Health Care Education/Training Program

## 2024-05-24 DIAGNOSIS — R072 Precordial pain: Secondary | ICD-10-CM | POA: Insufficient documentation

## 2024-05-24 DIAGNOSIS — R079 Chest pain, unspecified: Secondary | ICD-10-CM | POA: Diagnosis not present

## 2024-05-24 DIAGNOSIS — I10 Essential (primary) hypertension: Secondary | ICD-10-CM | POA: Diagnosis not present

## 2024-05-24 LAB — ECHOCARDIOGRAM COMPLETE
Area-P 1/2: 3.44 cm2
S' Lateral: 3.7 cm

## 2024-05-26 NOTE — Telephone Encounter (Signed)
 LVM asking pt to call our office to discuss test results. 1st attempt

## 2024-06-15 NOTE — Progress Notes (Unsigned)
 " Cardiology Office Note   Date: 06/16/2024  ID:  Lyrik Buresh 07/09/1980 [id number] PCP: Reita Locus, MD  [facility] Providers Cardiologist: Emeline FORBES Calender, DO     Chief Complaint: Alex Fields is a 44 y.o.male with PMH of hypertension, hyperlipidemia, myocarditis, epilepsy who presents to the clinic for two-month follow-up.     Mr. Joswick was hospitalized at an outside facility for chest pain in 2018.  Cardiac catheterization showed no significant CAD. He was treated for viral myocarditis.  He presented to the ER 03/24/2024 with headache and intermittent chest pain that did not radiate and was not exertional. He was under increased stress. His blood pressure was 151/106. EKG did show some ST elevation but this was felt to be due to early repolarization.  Troponin was negative. He was referred to cardiology for further evaluation of hypertension.   During follow-up visit he was having chest discomfort that was associated with activity. He was also hypertensive.  He had not been taking his antihypertensive and was trying to manage his blood pressure with diet and exercise.  Hydrochlorothiazide  was restarted and a coronary CTA and echo were ordered. Coronary CTA 05/04/2024 showed calcium  score of 0. Echo 05/24/2024 showed LVEF 55-60%, mild LVH, G1DD, moderately dilated LA, tricuspid AV.     History of Present Illness: Today Mr. Carattini is doing well. He has not had any further chest pain. We reviewed his coronary CTA and echo results and he is pleased with these results. He is not checking his BP at home, but he is taking hydrochlorothiazide  as prescribed. He has a fairly active job but states that he should exercise more. He is unaware of any family history of high cholesterol. He is fasting today.  ROS: Denies chest pain, shortness of breath, lower extremity edema, palpitations, lightheadedness, dizziness, syncope.   Studies Reviewed: The following studies were reviewed  today: Cardiac Studies & Procedures   ______________________________________________________________________________________________     ECHOCARDIOGRAM  ECHOCARDIOGRAM COMPLETE 05/24/2024  Narrative ECHOCARDIOGRAM REPORT    Patient Name:   Shaquille Janes Date of Exam: 05/24/2024 Medical Rec #:  [id number]      Height:       71.0 in Accession #:    [id number]     Weight:       229.0 lb Date of Birth:  1980-10-06      BSA:          2.233 m Patient Age:    43 years       BP:           142/104 mmHg Patient Gender: M              HR:           82 bpm. Exam Location:  [address]  Procedure: 2D Echo, 3D Echo, Cardiac Doppler and Color Doppler (Both Spectral and Color Flow Doppler were utilized during procedure).  Indications:    R07.9 Chest Pain  History:        Patient has no prior history of Echocardiogram examinations. Signs/Symptoms:Chest Pain; Risk Factors:Hypertension. Seizures.  Sonographer:    Heather Hawks RDCS Referring Phys: JOSEFA HERO CLEAVER  IMPRESSIONS   1. Left ventricular ejection fraction, by estimation, is 55 to 60%. Left ventricular ejection fraction by 3D volume is 68 %. The left ventricle has normal function. The left ventricle has no regional wall motion abnormalities. There is mild left ventricular hypertrophy. Left ventricular diastolic parameters are consistent with Grade I diastolic dysfunction (impaired relaxation).  2. Right ventricular systolic function is normal. The right ventricular size is normal. Tricuspid regurgitation signal is inadequate for assessing PA pressure. 3. Left atrial size was moderately dilated. 4. The mitral valve is normal in structure. No evidence of mitral valve regurgitation. No evidence of mitral stenosis. 5. The aortic valve is tricuspid. Aortic valve regurgitation is not visualized. No aortic stenosis is present. 6. The inferior vena cava is normal in size with greater than 50% respiratory variability, suggesting right  atrial pressure of 3 mmHg.  FINDINGS Left Ventricle: Left ventricular ejection fraction, by estimation, is 55 to 60%. Left ventricular ejection fraction by 3D volume is 68 %. The left ventricle has normal function. The left ventricle has no regional wall motion abnormalities. 3D ejection fraction reviewed and evaluated as part of the interpretation. Alternate measurement of EF is felt to be most reflective of LV function. The left ventricular internal cavity size was normal in size. There is mild left ventricular hypertrophy. Left ventricular diastolic parameters are consistent with Grade I diastolic dysfunction (impaired relaxation). Normal left ventricular filling pressure.  Right Ventricle: The right ventricular size is normal. No increase in right ventricular wall thickness. Right ventricular systolic function is normal. Tricuspid regurgitation signal is inadequate for assessing PA pressure.  Left Atrium: Left atrial size was moderately dilated.  Right Atrium: Right atrial size was normal in size.  Pericardium: There is no evidence of pericardial effusion.  Mitral Valve: The mitral valve is normal in structure. No evidence of mitral valve regurgitation. No evidence of mitral valve stenosis.  Tricuspid Valve: The tricuspid valve is normal in structure. Tricuspid valve regurgitation is not demonstrated. No evidence of tricuspid stenosis.  Aortic Valve: The aortic valve is tricuspid. Aortic valve regurgitation is not visualized. No aortic stenosis is present.  Pulmonic Valve: The pulmonic valve was normal in structure. Pulmonic valve regurgitation is not visualized. No evidence of pulmonic stenosis.  Aorta: The aortic root and ascending aorta are structurally normal, with no evidence of dilitation.  Venous: The inferior vena cava is normal in size with greater than 50% respiratory variability, suggesting right atrial pressure of 3 mmHg.  IAS/Shunts: No atrial level shunt detected by color  flow Doppler.  Additional Comments: 3D was performed not requiring image post processing on an independent workstation and was normal.   LEFT VENTRICLE PLAX 2D LVIDd:         5.15 cm         Diastology LVIDs:         3.70 cm         LV e' medial:    5.50 cm/s LV PW:         1.40 cm         LV E/e' medial:  7.0 LV IVS:        1.20 cm         LV e' lateral:   8.48 cm/s LVOT diam:     2.70 cm         LV E/e' lateral: 4.5 LV SV:         66 LV SV Index:   30 LVOT Area:     5.73 cm        3D Volume EF LV IVRT:       107 msec        LV 3D EF:    Left ventricul ar ejection fraction by 3D volume is 68 %.  3D Volume EF: 3D EF:  68 % LV EDV:       194 ml LV ESV:       61 ml LV SV:        133 ml  RIGHT VENTRICLE RV Basal diam:  4.00 cm     PULMONARY VEINS RV Mid diam:    3.40 cm     A Reversal Velocity: 44.70 cm/s RV S prime:     12.60 cm/s  Diastolic Velocity:  32.80 cm/s TAPSE (M-mode): 2.0 cm      S/D Velocity:        1.30 Systolic Velocity:   42.30 cm/s  LEFT ATRIUM             Index        RIGHT ATRIUM           Index LA diam:        4.60 cm 2.06 cm/m   RA Pressure: 3.00 mmHg LA Vol (A2C):   74.5 ml 33.36 ml/m  RA Area:     17.70 cm LA Vol (A4C):   47.7 ml 21.36 ml/m  RA Volume:   46.80 ml  20.95 ml/m LA Biplane Vol: 61.8 ml 27.67 ml/m AORTIC VALVE LVOT Vmax:   70.65 cm/s LVOT Vmean:  46.700 cm/s LVOT VTI:    0.116 m  AORTA Ao Root diam: 3.40 cm Ao Asc diam:  3.20 cm  MITRAL VALVE               TRICUSPID VALVE MV Area (PHT)  cm         Estimated RAP:  3.00 mmHg MV Decel Time: 221 msec MV E velocity: 38.60 cm/s  SHUNTS MV A velocity: 56.00 cm/s  Systemic VTI:  0.12 m MV E/A ratio:  0.69        Systemic Diam: 2.70 cm  Jerel Croitoru MD Electronically signed by Jerel Balding MD Signature Date/Time: 05/24/2024/3:10:38 PM    Final      CT SCANS  CT CORONARY MORPH W/CTA COR W/SCORE 05/04/2024  Addendum 05/15/2024  1:58 AM ADDENDUM REPORT:  05/15/2024 01:56  EXAM: OVER-READ INTERPRETATION  CT CHEST  The following report is an over-read performed by radiologist Dr. Oneil Devonshire of [facility] on 05/15/2024. This over-read does not include interpretation of cardiac or coronary anatomy or pathology. The coronary calcium  score/coronary CTA interpretation by the cardiologist is attached.  COMPARISON:  None.  FINDINGS: Cardiovascular: There are no significant extracardiac vascular findings.  Mediastinum/Nodes: There are no enlarged lymph nodes within the visualized mediastinum.  Lungs/Pleura: There is no pleural effusion. The visualized lungs appear clear.  Upper abdomen: No significant findings in the visualized upper abdomen.  Musculoskeletal/Chest wall: No chest wall mass or suspicious osseous findings within the visualized chest.  IMPRESSION: No significant extracardiac findings within the visualized chest.   Electronically Signed By: Oneil Devonshire M.D. On: 05/15/2024 01:56  Narrative CLINICAL DATA:  Chest pain  EXAM: Cardiac/Coronary CTA  TECHNIQUE: A non-contrast, gated CT scan was obtained with axial slices of 3 mm through the heart for calcium  scoring. Calcium  scoring was performed using the Agatston method. A 120 kV prospective, gated, contrast cardiac scan was obtained. Gantry rotation speed was 250 msecs and collimation was 0.6 mm. Two sublingual nitroglycerin  tablets (0.8 mg) were given. The 3D data set was reconstructed in 5% intervals of the 35-75% of the R-R cycle. Diastolic phases were analyzed on a dedicated workstation using MPR, MIP, and VRT modes. The patient received 95 cc of contrast.  FINDINGS: Image quality: Good.  Noise artifact is: Limited.  Coronary Arteries:  Normal coronary origin.  Right dominance.  Left main: The left main is a large caliber vessel with a normal take off from the left coronary cusp that trifurcates into a LAD, LCX, and ramus intermedius.  There is no plaque or stenosis.  Left anterior descending artery: The LAD is patent without evidence of plaque or stenosis. The LAD gives off 2 patent diagonal branches. Possible minimal soft plaque in the prox to mid D1 (<25%) vs. artifact.  Ramus intermedius: Patent with no evidence of plaque or stenosis.  Left circumflex artery: The LCX is non-dominant and patent with no evidence of plaque or stenosis. The LCX gives off 3 patent obtuse marginal branches.  Right coronary artery: The RCA is dominant with normal take off from the right coronary cusp. There is no evidence of plaque or stenosis. The RCA terminates as a PDA and right posterolateral branch without evidence of plaque or stenosis.  Right Atrium: Right atrial size is within normal limits.  Right Ventricle: The right ventricular cavity is within normal limits.  Left Atrium: Left atrial size is normal in size with no left atrial appendage filling defect.  Left Ventricle: The ventricular cavity size is within normal limits.  Pulmonary arteries: Normal in size.  Pulmonary veins: Normal pulmonary venous drainage.  Pericardium: Normal thickness without significant effusion or calcium  present.  Cardiac valves: The aortic valve is trileaflet without significant calcification. The mitral valve is normal without significant calcification.  Aorta: Normal caliber without significant disease.  Extra-cardiac findings: See attached radiology report for non-cardiac structures.  IMPRESSION: 1. Coronary calcium  score of 0. This was 0 percentile for age-, sex, and race-matched controls.  2. Normal coronary origin with right dominance.  3. Possible minimal soft plaque in the prox to mid D1 vs artifact otherwise normal coronary arteries.  4. Consider non atherosclerotic causes of chest pain.  RECOMMENDATIONS: 1. CAD-RADS 0: No evidence of CAD (0%). Consider non-atherosclerotic causes of chest pain.  2. CAD-RADS 1:  Minimal non-obstructive CAD (0-24%). Consider non-atherosclerotic causes of chest pain. Consider preventive therapy and risk factor modification.  3. CAD-RADS 2: Mild non-obstructive CAD (25-49%). Consider non-atherosclerotic causes of chest pain. Consider preventive therapy and risk factor modification.  4. CAD-RADS 3: Moderate stenosis. Consider symptom-guided anti-ischemic pharmacotherapy as well as risk factor modification per guideline directed care. Additional analysis with CT FFR will be submitted.  5. CAD-RADS 4: Severe stenosis. (70-99% or > 50% left main). Cardiac catheterization or CT FFR is recommended. Consider symptom-guided anti-ischemic pharmacotherapy as well as risk factor modification per guideline directed care. Invasive coronary angiography recommended with revascularization per published guideline statements.  6. CAD-RADS 5: Total coronary occlusion (100%). Consider cardiac catheterization or viability assessment. Consider symptom-guided anti-ischemic pharmacotherapy as well as risk factor modification per guideline directed care.  7. CAD-RADS N: Non-diagnostic study. Obstructive CAD can't be excluded. Alternative evaluation is recommended.  Wilbert Bihari, MD  Electronically Signed: By: Wilbert Bihari M.D. On: 05/04/2024 13:36     ______________________________________________________________________________________________                        Physical Exam: VS: BP 112/88   Pulse 90   Ht 5' 11 (1.803 m)   Wt 224 lb 12.8 oz (102 kg)   SpO2 97%   BMI 31.35 kg/m   GEN: Well nourished, in NAD HEENT: Normal NECK: No JVD CARDIAC: RRR, no murmurs, rubs, gallops RESPIRATORY:  Clear to auscultation bilaterally ABDOMEN: Soft, non-tender, non-distended MUSCULOSKELETAL: No edema SKIN: Warm and dry NEUROLOGIC:  Alert and oriented x 3 PSYCHIATRIC:  Pleasant, normal affect   Assessment & Plan: 1. Hyperlipidemia: 04/13/2024 LDL 200, HDL 55, TGs  192, total 291. He is unaware of family history of hyperlipidemia, heart attack, or stroke.  - Check fasting lipid panel, hepatic function panel, and lipoprotein A - Continue atorvastatin  10 mg daily - plan to increase to 20 mg if LPA normal but LDL still high versus refer to lipid clinic for PCSK9i if LPA high - Recommend high-fiber, low-fat diet and regular physical exercise as tolerated  2. Hypertension: BP today 112/88. Well-controlled today, he is not checking this at home. His echo did show mild LVH. We discussed the importance of maintaining normal blood pressure to prevent this from worsening. - Continue hydrochlorothiazide  25 mg daily  Dispo: Follow-up in 9-12 months with Dr. Kriste.  Signed, Saddie GORMAN Cleaves, NP 06/16/2024 8:07 AM [facility] "

## 2024-06-16 ENCOUNTER — Ambulatory Visit: Attending: General Practice

## 2024-06-16 ENCOUNTER — Encounter: Payer: Self-pay | Admitting: General Practice

## 2024-06-16 VITALS — BP 112/88 | HR 90 | Ht 71.0 in | Wt 224.8 lb

## 2024-06-16 DIAGNOSIS — E782 Mixed hyperlipidemia: Secondary | ICD-10-CM

## 2024-06-16 DIAGNOSIS — I10 Essential (primary) hypertension: Secondary | ICD-10-CM

## 2024-06-16 NOTE — Patient Instructions (Signed)
 Medication Instructions:  Your physician recommends that you continue on your current medications as directed. Please refer to the Current Medication list given to you today.  *If you need a refill on your cardiac medications before your next appointment, please call your pharmacy*  Lab Work: Today: Lipoprotein A, LFTs, Lipid Panel If you have labs (blood work) drawn today and your tests are completely normal, you will receive your results only by: MyChart Message (if you have MyChart) OR A paper copy in the mail If you have any lab test that is abnormal or we need to change your treatment, we will call you to review the results.  Testing/Procedures: None   Follow-Up: At [facility], you and your health needs are our priority.  As part of our continuing mission to provide you with exceptional heart care, our providers are all part of one team.  This team includes your primary Cardiologist (physician) and Advanced Practice Providers or APPs (Physician Assistants and Nurse Practitioners) who all work together to provide you with the care you need, when you need it.  Your next appointment:   1 year(s)  Provider:   Emeline FORBES Calender, DO    We recommend signing up for the patient portal called MyChart.  Sign up information is provided on this After Visit Summary.  MyChart is used to connect with patients for Virtual Visits (Telemedicine).  Patients are able to view lab/test results, encounter notes, upcoming appointments, etc.  Non-urgent messages can be sent to your provider as well.   To learn more about what you can do with MyChart, go to [email or web address].   Other Instructions None

## 2024-06-19 LAB — HEPATIC FUNCTION PANEL
ALT: 36 [IU]/L (ref 0–44)
AST: 32 [IU]/L (ref 0–40)
Albumin: 4.7 g/dL (ref 4.1–5.1)
Alkaline Phosphatase: 73 [IU]/L (ref 47–123)
Bilirubin Total: 0.7 mg/dL (ref 0.0–1.2)
Bilirubin, Direct: 0.26 mg/dL (ref 0.00–0.40)
Total Protein: 7.5 g/dL (ref 6.0–8.5)

## 2024-06-19 LAB — LIPID PANEL
Chol/HDL Ratio: 4 ratio (ref 0.0–5.0)
Cholesterol, Total: 213 mg/dL — ABNORMAL HIGH (ref 100–199)
HDL: 53 mg/dL
LDL Chol Calc (NIH): 142 mg/dL — ABNORMAL HIGH (ref 0–99)
Triglycerides: 98 mg/dL (ref 0–149)
VLDL Cholesterol Cal: 18 mg/dL (ref 5–40)

## 2024-06-19 LAB — LIPOPROTEIN A (LPA): Lipoprotein (a): 93.2 nmol/L — ABNORMAL HIGH

## 2024-06-20 ENCOUNTER — Ambulatory Visit: Payer: Self-pay

## 2024-06-20 ENCOUNTER — Other Ambulatory Visit: Payer: Self-pay

## 2024-06-20 DIAGNOSIS — E782 Mixed hyperlipidemia: Secondary | ICD-10-CM

## 2024-06-20 MED ORDER — ATORVASTATIN CALCIUM 20 MG PO TABS: 20.0000 mg | ORAL_TABLET | Freq: Every day | ORAL | 3 refills | Status: AC

## 2024-06-28 ENCOUNTER — Other Ambulatory Visit: Payer: Self-pay | Admitting: General Practice

## 2024-06-28 DIAGNOSIS — R079 Chest pain, unspecified: Secondary | ICD-10-CM

## 2024-06-28 DIAGNOSIS — R072 Precordial pain: Secondary | ICD-10-CM

## 2024-06-28 DIAGNOSIS — I10 Essential (primary) hypertension: Secondary | ICD-10-CM
